# Patient Record
Sex: Male | Born: 1985
Health system: Southern US, Community
[De-identification: ages and names within clinical notes are randomized; demographics above are authoritative.]

## PROBLEM LIST (undated history)

## (undated) DIAGNOSIS — E119 Type 2 diabetes mellitus without complications: Secondary | ICD-10-CM

## (undated) HISTORY — DX: Type 2 diabetes mellitus without complications: E11.9

---

## 2004-07-10 ENCOUNTER — Ambulatory Visit: Payer: Self-pay | Admitting: Unknown Physician Specialty

## 2004-07-18 ENCOUNTER — Ambulatory Visit: Payer: Self-pay | Admitting: Unknown Physician Specialty

## 2007-06-26 ENCOUNTER — Ambulatory Visit: Payer: Self-pay | Admitting: Family Medicine

## 2008-08-13 ENCOUNTER — Inpatient Hospital Stay: Payer: Self-pay | Admitting: Internal Medicine

## 2010-12-05 ENCOUNTER — Emergency Department: Payer: Self-pay | Admitting: Emergency Medicine

## 2011-05-26 ENCOUNTER — Other Ambulatory Visit: Payer: Self-pay

## 2011-05-26 ENCOUNTER — Ambulatory Visit (HOSPITAL_COMMUNITY)
Admission: RE | Admit: 2011-05-26 | Discharge: 2011-05-26 | Disposition: A | Discharge status: Home / Self Care | Payer: BC Managed Care – PPO | Source: Ambulatory Visit | Attending: General Practice | Admitting: General Practice

## 2011-05-26 DIAGNOSIS — H538 Other visual disturbances: Secondary | ICD-10-CM | POA: Insufficient documentation

## 2011-05-26 DIAGNOSIS — M436 Torticollis: Secondary | ICD-10-CM | POA: Insufficient documentation

## 2011-05-26 DIAGNOSIS — E119 Type 2 diabetes mellitus without complications: Secondary | ICD-10-CM | POA: Insufficient documentation

## 2011-05-26 DIAGNOSIS — R51 Headache: Secondary | ICD-10-CM | POA: Insufficient documentation

## 2011-05-26 DIAGNOSIS — R519 Headache, unspecified: Secondary | ICD-10-CM

## 2011-05-26 MED ORDER — GADOBENATE DIMEGLUMINE 529 MG/ML IV SOLN
15.0000 mL | Freq: Once | INTRAVENOUS | Status: AC | PRN
  Administered 2011-05-26: 13 mL via INTRAVENOUS

## 2011-05-27 LAB — POCT I-STAT, CHEM 8
Chloride: 101 mEq/L (ref 96–112)
HCT: 47 % (ref 39.0–52.0)
Hemoglobin: 16 g/dL (ref 13.0–17.0)
Potassium: 3.8 mEq/L (ref 3.5–5.1)
Sodium: 140 mEq/L (ref 135–145)

## 2011-05-30 ENCOUNTER — Emergency Department: Payer: Self-pay | Admitting: Emergency Medicine

## 2011-05-30 LAB — CBC
MCH: 30.3 pg (ref 26.0–34.0)
MCHC: 34.2 g/dL (ref 32.0–36.0)
MCV: 88 fL (ref 80–100)
Platelet: 345 10*3/uL (ref 150–440)
RDW: 13.1 % (ref 11.5–14.5)
WBC: 8.1 10*3/uL (ref 3.8–10.6)

## 2011-05-30 LAB — COMPREHENSIVE METABOLIC PANEL
Albumin: 4.5 g/dL (ref 3.4–5.0)
Alkaline Phosphatase: 73 U/L (ref 50–136)
BUN: 10 mg/dL (ref 7–18)
Bilirubin,Total: 1.1 mg/dL — ABNORMAL HIGH (ref 0.2–1.0)
Chloride: 102 mmol/L (ref 98–107)
Creatinine: 1.32 mg/dL — ABNORMAL HIGH (ref 0.60–1.30)
EGFR (Non-African Amer.): >60
Glucose: 222 mg/dL — ABNORMAL HIGH (ref 65–99)
Potassium: 4.2 mmol/L (ref 3.5–5.1)
SGPT (ALT): 23 U/L
Sodium: 138 mmol/L (ref 136–145)
Total Protein: 7.9 g/dL (ref 6.4–8.2)

## 2011-07-12 ENCOUNTER — Ambulatory Visit: Payer: Self-pay | Admitting: General Practice

## 2011-07-15 ENCOUNTER — Ambulatory Visit: Payer: Self-pay | Admitting: Urology

## 2011-07-19 HISTORY — PX: NECK SURGERY: SHX720

## 2012-04-18 ENCOUNTER — Ambulatory Visit: Payer: Self-pay | Admitting: Family Medicine

## 2014-03-18 ENCOUNTER — Encounter: Payer: Self-pay | Admitting: Endocrinology

## 2014-04-02 ENCOUNTER — Encounter: Payer: Self-pay | Admitting: Internal Medicine

## 2014-04-02 ENCOUNTER — Ambulatory Visit (INDEPENDENT_AMBULATORY_CARE_PROVIDER_SITE_OTHER): Payer: BLUE CROSS/BLUE SHIELD | Admitting: Internal Medicine

## 2014-04-02 ENCOUNTER — Other Ambulatory Visit: Payer: Self-pay | Admitting: *Deleted

## 2014-04-02 VITALS — BP 112/76 | HR 93 | Temp 98.4°F | Resp 12 | Ht 66.0 in | Wt 149.8 lb

## 2014-04-02 DIAGNOSIS — E109 Type 1 diabetes mellitus without complications: Secondary | ICD-10-CM

## 2014-04-02 DIAGNOSIS — E1065 Type 1 diabetes mellitus with hyperglycemia: Principal | ICD-10-CM

## 2014-04-02 DIAGNOSIS — IMO0001 Reserved for inherently not codable concepts without codable children: Secondary | ICD-10-CM

## 2014-04-02 MED ORDER — GLUCAGON (RDNA) 1 MG IJ KIT: 1.0000 mg | PACK | Freq: Once | INTRAMUSCULAR | Status: DC | PRN

## 2014-04-02 MED ORDER — INSULIN GLARGINE 100 UNIT/ML SOLOSTAR PEN
32.0000 [IU] | PEN_INJECTOR | Freq: Every day | SUBCUTANEOUS | Status: DC
Start: 2014-04-02 — End: 2014-09-20

## 2014-04-02 MED ORDER — INSULIN ASPART 100 UNIT/ML FLEXPEN: 6.0000 [IU] | PEN_INJECTOR | Freq: Three times a day (TID) | SUBCUTANEOUS | Status: DC

## 2014-04-02 MED ORDER — GLUCOSE BLOOD VI STRP: ORAL_STRIP | Status: DC

## 2014-04-02 NOTE — Progress Notes (Signed)
Patient ID: Brian Braun, male   DOB: [birth year], 28 y.o.   MRN: [id number]   HPI: Brian Braun is a 28 y.o.-year-old male, self -referred , for management of DM1, uncontrolled, without complications. He saw Dr Gabriel Carina in the past. He previously saw Dr. Francoise Schaumann.  Patient has been diagnosed with diabetes in 1996; he started on insulin at dx.   Last hemoglobin A1c was: 10/11/2013: 7.6% 06/13/2013: 7.3%  Pt was on an insulin pump: Medtronic Minimed up to 2011 >> developed DKA >> did not restart after this, but he is not interested to restart.  He is now on: - Lantus 30 units at bedtime - NovoLog: ICR 1:15; target 130, ISF 30  He has an Therapist, occupational.   Pt checks his sugars 3-4x a day and they are: - am: 150-180 - 2h after b'fast: n/c - before lunch: 120-150 - 2h after lunch: n/c - before dinner: 150-200 (not as active in the wintertime) - 2h after dinner: n/c - bedtime: 140-170, 200 rarely - nighttime:  Can have lows. Lowest sugar was 40s >> last 2 mo ago: 70; he has hypoglycemia awareness at 34s. No previous hypoglycemia admission. Does have a glucagon kit at home. Highest sugar was 400s - seldom. + previous DKA admission - 1x: 2011.    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  Pt's meals are: - Breakfast: (Usually takes 11 units of NovoLog): oatmeal or bacon and eggs + cheese biscuit - Lunch: (12-13 units): sandwich or hamburger - Dinner: (11-12 units): grilled chicken and rice; spaghetti; sloppy joes - Snacks:PB crackers at 10 am; cheeze its - boluses 3-4 units   - no CKD, last BUN/creatinine:  Lab Results  Component Value Date   BUN 8 05/26/2011   CREATININE 1.00 05/26/2011   - No lipid levels available - last eye exam was in 2014. No DR.  - no numbness and tingling in his feet.  Pt has no FH of DM.  ROS: Constitutional: no weight gain/loss, no fatigue, no subjective  hyperthermia/hypothermia Eyes: no blurry vision, no xerophthalmia ENT: no sore throat, no nodules palpated in throat, no dysphagia/odynophagia, no hoarseness Cardiovascular: no CP/SOB/palpitations/leg swelling Respiratory: no cough/SOB Gastrointestinal: no N/V/D/C Musculoskeletal: no muscle/joint aches Skin: no rashes Neurological: no tremors/numbness/tingling/dizziness Psychiatric: no depression/anxiety  Past Medical History  Diagnosis Date  . Diabetes mellitus without complication     Type I    Past Surgical History  Procedure Laterality Date  . Neck surgery  07/2011   History   Social History  . Marital Status:  married     Spouse Name: N/A    Number of Children: 1   Occupational History  .  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 Smoking status: Never Smoker   . Smokeless tobacco: Current User    Types: Chew  . Alcohol Use: 0.0 oz/week    0 Not specified per week  . Drug Use: No   Social History Narrative   Married   1 stepson   No Known Allergies  Family history: None  PE: BP 112/76 mmHg  Pulse 93  Temp(Src) 98.4 F (36.9 C) (Oral)  Resp 12  Ht 5' 6" (1.676 m)  Wt 149 lb 12.8 oz (67.949 kg)  BMI  24.19 kg/m2  SpO2 98% Wt Readings from Last 3 Encounters:  04/02/14 149 lb 12.8 oz (67.949 kg)   Constitutional: Normal, in NAD Eyes: PERRLA, EOMI, no exophthalmos ENT: moist mucous membranes, no thyromegaly, no cervical lymphadenopathy Cardiovascular: RRR, No MRG Respiratory: CTA B Gastrointestinal: abdomen soft, NT, ND, BS+ Musculoskeletal: no deformities, strength intact in all 4 Skin: moist, warm, no rashes Neurological: no tremor with outstretched hands, DTR normal in all 4  ASSESSMENT: 1. DM1, uncontrolled, without complications  PLAN:  1. Patient with long-standing, fairly well controlled DM1, on basal-bolus  insulin therapy. 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 We will increase Lantus by 2-4 units, depending on morning sugars. I also advised him to change his insulin to carb ratio with lunch, as the sugars before dinner are the highest of the day. This will be fairly easy to do since he has an Accu-Chek expert. I advised him to let me know if he is not able to make the change in his meter. Linda Spagnnola, the diabetes educator can also help him to make the changes, If needed. - We discussed about changes to his insulin regimen, as follows:  Patient Instructions  Please increase Lantus 32 units at bedtime >> if sugars in am not <150 after this, in 1 week, please increase to 34 units. Please change NovoLog as follows: - Breakfast: ICR 1:15, target 130, ISF 30  - Lunch: ICR: 1:13, target 130, ISF 30 - Dinner: ICR 1:15; target 130, ISF 30   Please stop at the lab.  Please return in 1 month.  - Continue checking sugars at different times of the day - check 4 times a day, rotating checks - given sugar log and advised how to fill it and to bring it at next appt  - given foot care handout and explained the principles  - given instructions for hypoglycemia management "15-15 rule"  - advised for yearly eye exams - sent glucagon kit x2 Rx to pharmacy - advised to get ketone strips - advised to always have Glu tablets with him - advised for a Med-alert bracelet mentioning "type 1 diabetes mellitus". - given instruction Re: exercising and driving in DM1 (pt instructions) - no signs of other autoimmune disorders, but no previous TSH levels available for review - Today we will check: Orders Placed This Encounter  Procedures  . HgB A1c  . Microalbumin / creatinine urine ratio  . Comp Met (CMET)  . Lipid Profile  . TSH  - I  reviewed records from Dr. Solum, patient's diabetes was never very uncontrolled, his range is 7.2-7.6%. I suspect that he still has some insulin production from his pancreas. Since he is not interested in the insulin pump right now, I would not check a C-peptide. - I refilled his insulins and his glucose strips - Return to clinic in 1 mo with sugar log   - time spent with the patient: 1 hour, of which >50% was spent in obtaining information about hisdisease, reviewing together  previous labs, office visit notes, sugar values, previous DM treatments, and also counseling pt about hisdiabetes  (please see the discussed topics above), and developing a plan to prevent further hypoglycemia and hyperglycemia.   Component     Latest Ref Rng 04/09/2014  Sodium       135 - 145 mEq/L 138  Potassium     3.5 - 5.1 mEq/L 3.6  Chloride     96 - 112 mEq/L 103  CO2     19 - 32 mEq/L 29  Glucose     70 - 99 mg/dL 99  BUN     6 - 23 mg/dL 9  Creatinine     0.4 - 1.5 mg/dL 1.2  Total Bilirubin     0.2 - 1.2 mg/dL 1.1  Alkaline Phosphatase     39 - 117 U/L 60  AST     0 - 37 U/L 20  ALT     0 - 53 U/L 14  Total Protein     6.0 - 8.3 g/dL 7.2  Albumin     3.5 - 5.2 g/dL 4.4  Calcium     8.4 - 10.5 mg/dL 9.3  GFR     >60.00 mL/min 78.07  Cholesterol     0 - 200 mg/dL 125  Triglycerides     0.0 - 149.0 mg/dL 88.0  HDL     >39.00 mg/dL 36.70 (L)  VLDL     0.0 - 40.0 mg/dL 17.6  LDL (calc)     0 - 99 mg/dL 71  Total CHOL/HDL Ratio      3  NonHDL      88.30  Microalb, Ur     0.0 - 1.9 mg/dL 4.0 (H)  Creatinine,U      198.0  MICROALB/CREAT RATIO     0.0 - 30.0 mg/g 2.0  TSH     0.35 - 4.50 uIU/mL 0.83  Hgb A1c MFr Bld     4.6 - 6.5 % 7.6 (H)   HbA1c stable at 7.6%, no urinary proteins, TSH normal, cholesterol at goal except HDL which is slightly low, normal CMP. 

## 2014-04-02 NOTE — Patient Instructions (Signed)
Please increase Lantus 32 units at bedtime >> if sugars in am not <150 after this, in 1 week, please increase to 34 units. Please change NovoLog as follows: - Breakfast: ICR 1:15, target 130, ISF 30  - Lunch: ICR: 1:13, target 130, ISF 30 - Dinner: ICR 1:15; target 130, ISF 30   Please stop at the lab.  Please return in 1 month.  Basic Rules for Patients with Type I Diabetes Mellitus  1. The American Diabetes Association (ADA) recommended targets: - fasting sugar <130 - after meal sugar <180 - HbA1C <7%  2. Engage in ?150 min moderate exercise per week  3. Make sure you have ?8h of sleep every night as this helps both blood sugars and your weight.  4. Always keep a sugar log (not only record in your meter) and bring it to all appointments with us.  5. "15-15 rule" for hypoglycemia: if sugars are low, take 15 g of carbs** ("fast sugar" - e.g. 4 glucose tablets, 4 oz orange juice), wait 15 min, then check sugars again. If still <80, repeat. Continue  until your sugars >80, then eat a normal meal.   6. Teach family members and coworkers to inject glucagon. Have a glucagon set at home and one at work. They should call 911 after using the set.  7. Check sugar before driving. If <100, correct, and only start driving if sugars rise ?[id number]. Check sugar every hour when on a long drive.  8. Check sugar before exercising. If <100, correct, and only start exercising if sugars rise ?100. Check sugar every hour when on a long exercise routine and 1h after you finished exercising.   If >250, check urine for ketones. If you have moderate-large ketones in urine, do not start exercise. Hydrate yourself with clear liquids and correct the high sugar. Recheck sugars and ketones before attempting to exercise.  Be aware that you might need less insulin when exercising.  *intense, short, exercise bursts can increase your sugars, but  *less intense, longer (>1h), exercise routines can decrease your sugars.    9. Make sure you have a MedAlert bracelet or pendant mentioning "Type I Diabetes Mellitus". If you have a prior episode of severe hypoglycemia or hypoglycemia unawareness, it should also mention this.  10. Please do not walk barefoot. Inspect your feet for sores/cuts and let us know if you have them.  11. Please call [facility] with any questions and concerns [phone]).   **E.g. of "fast carbs": ? first choice (15 g):  1 tube glucose gel, GlucoPouch 15, 2 oz glucose liquid ? second choice (15-16 g):  3 or 4 glucose tablets (best taken  with water), 15 Dextrose Bits chewable ? third choice (15-20 g):   cup fruit juice,  cup regular soda, 1 cup skim milk,  1 cup sports drink ? fourth choice (15-20 g):  1 small tube [place] gel (not frosting), 2 tbsp raisins, 1 tbsp table sugar,  candy, jelly beans, gum drops - check package for carb amount   (adapted from: Juluis RainierMcCall A.L. "Insulin therapy and hypoglycemia" Endocrinol Metab Clin N Am 2012, 41: 57-87)

## 2014-04-09 ENCOUNTER — Other Ambulatory Visit (INDEPENDENT_AMBULATORY_CARE_PROVIDER_SITE_OTHER): Payer: BC Managed Care – PPO

## 2014-04-09 DIAGNOSIS — E109 Type 1 diabetes mellitus without complications: Secondary | ICD-10-CM

## 2014-04-09 DIAGNOSIS — E1065 Type 1 diabetes mellitus with hyperglycemia: Principal | ICD-10-CM

## 2014-04-09 DIAGNOSIS — IMO0001 Reserved for inherently not codable concepts without codable children: Secondary | ICD-10-CM

## 2014-04-09 LAB — COMPREHENSIVE METABOLIC PANEL
ALBUMIN: 4.4 g/dL (ref 3.5–5.2)
ALT: 14 U/L (ref 0–53)
AST: 20 U/L (ref 0–37)
Alkaline Phosphatase: 60 U/L (ref 39–117)
BILIRUBIN TOTAL: 1.1 mg/dL (ref 0.2–1.2)
BUN: 9 mg/dL (ref 6–23)
CO2: 29 mEq/L (ref 19–32)
Calcium: 9.3 mg/dL (ref 8.4–10.5)
Chloride: 103 mEq/L (ref 96–112)
Creatinine, Ser: 1.2 mg/dL (ref 0.4–1.5)
GFR: 78.07 mL/min (ref >60.00)
GLUCOSE: 99 mg/dL (ref 70–99)
POTASSIUM: 3.6 meq/L (ref 3.5–5.1)
SODIUM: 138 meq/L (ref 135–145)
Total Protein: 7.2 g/dL (ref 6.0–8.3)

## 2014-04-09 LAB — HEMOGLOBIN A1C: HEMOGLOBIN A1C: 7.6 % — AB (ref 4.6–6.5)

## 2014-04-09 LAB — MICROALBUMIN / CREATININE URINE RATIO
CREATININE, U: 198 mg/dL
MICROALB UR: 4 mg/dL — AB (ref 0.0–1.9)
MICROALB/CREAT RATIO: 2 mg/g (ref 0.0–30.0)

## 2014-04-09 LAB — LIPID PANEL
CHOL/HDL RATIO: 3
Cholesterol: 125 mg/dL (ref 0–200)
HDL: 36.7 mg/dL — ABNORMAL LOW (ref >39.00)
LDL Cholesterol: 71 mg/dL (ref 0–99)
NONHDL: 88.3
Triglycerides: 88 mg/dL (ref 0.0–149.0)
VLDL: 17.6 mg/dL (ref 0.0–40.0)

## 2014-04-09 LAB — TSH: TSH: 0.83 u[IU]/mL (ref 0.35–4.50)

## 2014-04-10 ENCOUNTER — Encounter: Payer: Self-pay | Admitting: *Deleted

## 2014-05-09 ENCOUNTER — Ambulatory Visit: Payer: BC Managed Care – PPO | Admitting: Internal Medicine

## 2014-05-09 ENCOUNTER — Ambulatory Visit (INDEPENDENT_AMBULATORY_CARE_PROVIDER_SITE_OTHER): Payer: Self-pay | Admitting: Internal Medicine

## 2014-05-09 ENCOUNTER — Encounter: Payer: Self-pay | Admitting: Internal Medicine

## 2014-05-09 VITALS — BP 118/76 | HR 86 | Temp 98.1°F | Resp 12 | Wt 151.0 lb

## 2014-05-09 DIAGNOSIS — E119 Type 2 diabetes mellitus without complications: Secondary | ICD-10-CM

## 2014-05-09 NOTE — Patient Instructions (Signed)
Please continue Lantus 32 units and move this to am. Please change NovoLog as follows: - Breakfast: ICR 1:15, target 120, ISF 30  - Lunch: ICR: 1:13, target 120, ISF 30 - Dinner: ICR 1:15; target 120, ISF 30   Please return in 1 month.

## 2014-05-09 NOTE — Progress Notes (Signed)
Patient ID: Brian Braun, male   DOB: [birth year], 29 y.o.   MRN: [id number]   HPI: Brian Braun is a 29 y.o.-year-old male, returning for f/u for DM1, dx 1996, uncontrolled, without complications. Last visit 1 mo ago. He is here with his wife who offers part of the history.  Last hemoglobin A1c was: Lab Results  Component Value Date   HGBA1C 7.6* 04/09/2014   10/11/2013: 7.6% 06/13/2013: 7.3%  Pt was on an insulin pump: Medtronic Minimed up to 2011 >> developed DKA >> did not restart after this, but he is not interested to restart.  He is now on: - Lantus 30 >> 32 units at bedtime - NovoLog as follows: - Breakfast: ICR 1:15, target 130, ISF 30  - Lunch: ICR: 1:13, target 130, ISF 30 - Dinner: ICR 1:15; target 130, ISF 30  Ends up 36 units (10-12 per meal) He has an Therapist, occupational.   Pt checks his sugars 4x a day and they are higher over the holidays. He brings a very detailed log, with 4 checks a day, without low CBGs. However, patient's wife is telling me that he is hypoglycemic in the early morning and more difficult to arise. They only check his sugars once and he was in the 40, otherwise they are not checking when they suspect a low blood sugar. - am: 150-180 >> 98-208 - 2h after b'fast: n/c - before lunch: 120-150 >> 90, 118-208, 221 - 2h after lunch: n/c - before dinner: 150-200 (not as active in the wintertime) >> 95-235 - 2h after dinner: n/c - bedtime: 140-170, 200 rarely >>141-241 - nighttime:  Can have lows. Lowest sugar was 40 - early am; he has hypoglycemia awareness at 11s. No previous hypoglycemia admission. Does have a glucagon kit at home. Highest sugar was 400s - seldom. + previous DKA admission - 1x: 2011.    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  Pt's meals are: - Breakfast: (Usually takes 11 units of NovoLog): oatmeal or bacon and eggs + cheese biscuit - Lunch: (12-13  units): sandwich or hamburger - Dinner: (11-12 units): grilled chicken and rice; spaghetti; sloppy joes - Snacks:PB crackers at 10 am; cheeze its - boluses 3-4 units   - no CKD, last BUN/creatinine:  Lab Results  Component Value Date   BUN 9 04/09/2014   CREATININE 1.2 04/09/2014   No urinary protein at last check: Component     Latest Ref Rng 04/09/2014  Microalb, Ur     0.0 - 1.9 mg/dL 4.0 (H)  Creatinine,U      198.0  MICROALB/CREAT RATIO     0.0 - 30.0 mg/g 2.0   - Latest lipid panel: Lab Results  Component Value Date   CHOL 125 04/09/2014   HDL 36.70* 04/09/2014   LDLCALC 71 04/09/2014   TRIG 88.0 04/09/2014   CHOLHDL 3 04/09/2014   - Last TSH level: Lab Results  Component Value Date   TSH 0.83 04/09/2014   - last eye exam was in 2014. No DR.  - no numbness and tingling in his feet.  ROS: Constitutional: no weight gain/loss, no fatigue, no subjective hyperthermia/hypothermia Eyes: no blurry vision, no xerophthalmia ENT: no sore throat, no nodules palpated in throat, no dysphagia/odynophagia, no hoarseness Cardiovascular: no CP/SOB/palpitations/leg swelling Respiratory: no cough/SOB Gastrointestinal: no N/V/D/C Musculoskeletal: no muscle/joint aches Skin: no rashes Neurological: no tremors/numbness/tingling/dizziness  I reviewed  pt's medications, allergies, PMH, social hx, family hx, and changes were documented in the history of present illness. Otherwise, unchanged from my initial visit note:  Past Medical History  Diagnosis Date  . Diabetes mellitus without complication     Type I    Past Surgical History  Procedure Laterality Date  . Neck surgery  07/2011   History   Social History  . Marital Status:  married     Spouse Name: N/A    Number of Children: 1   Occupational History  .  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 Smoking status: Never Smoker   . Smokeless tobacco: Current User    Types: Chew  . Alcohol Use: 0.0 oz/week    0 Not specified per week  . Drug Use: No   Social History Narrative   Married   1 stepson   No Known Allergies  Family history: None  PE: BP 118/76 mmHg  Pulse 86  Temp(Src) 98.1 F (36.7 C) (Oral)  Resp 12  Wt 151 lb (68.493 kg)  SpO2 96% Wt Readings from Last 3 Encounters:  05/09/14 151 lb (68.493 kg)  04/02/14 149 lb 12.8 oz (67.949 kg)   Constitutional: Normal, in NAD Eyes: PERRLA, EOMI, no exophthalmos ENT: moist mucous membranes, no thyromegaly, no cervical lymphadenopathy Cardiovascular: RRR, No MRG Respiratory: CTA B Gastrointestinal: abdomen soft, NT, ND, BS+ Musculoskeletal: no deformities, strength intact in all 4 Skin: moist, warm, no rashes Neurological: no tremor with outstretched hands, DTR normal in all 4  ASSESSMENT: 1. DM1, uncontrolled, without complications - I reviewed records from Dr. Gabriel Carina, patient's diabetes was never very uncontrolled, his range is 7.2-7.6%. I suspect that he still has some insulin production from his pancreas. Since he is not interested in the insulin pump right now, I did not check a C-peptide.  PLAN:  1. Patient with long-standing, fairly well controlled DM1, on basal-bolus insulin therapy. His sugars are high in the morning, but per his wife's report, he is more groggy in the morning and more difficult to arise. She suspects early morning hypoglycemia episodes. They did not check the sugars when this happened, but they did check once and he was in the 40s. There are no low blood sugars he his log and I strongly advised him to write them down whenever they happen. - I advised them to change Lantus from evening to a.m. to avoid precipitous drops in blood sugars overnight. If this does not work, we'll need to split Lantus in 2 doses. - I also advised him to change his target sugar from 130 to 120. This will be  fairly easy to do since he has an Accu-Chek expert. - We discussed about changes to his insulin regimen, as follows:  Patient Instructions  Please continue Lantus 32 units and move this to am. Please change NovoLog as follows: - Breakfast: ICR 1:15, target 120, ISF 30  - Lunch: ICR: 1:13, target 120, ISF 30 - Dinner: ICR 1:15; target 120, ISF 30   Please return in 1 month.  - Continue checking sugars at different times of the day - check 4 times a day, rotating checks - advised for yearly eye exams >> he is due - no signs of other autoimmune disorders - Return to clinic in 1 mo with sugar log

## 2014-06-20 ENCOUNTER — Ambulatory Visit (INDEPENDENT_AMBULATORY_CARE_PROVIDER_SITE_OTHER): Payer: BC Managed Care – PPO | Admitting: Internal Medicine

## 2014-06-20 ENCOUNTER — Encounter: Payer: Self-pay | Admitting: Internal Medicine

## 2014-06-20 VITALS — BP 120/68 | HR 90 | Temp 98.2°F | Resp 12 | Wt 153.0 lb

## 2014-06-20 DIAGNOSIS — E119 Type 2 diabetes mellitus without complications: Secondary | ICD-10-CM

## 2014-06-20 NOTE — Patient Instructions (Signed)
Please continue Lantus 32 units in am.  Please change NovoLog as follows: - Breakfast: ICR 1:15, target 120, ISF 30  - Lunch: ICR: 1:13, target 120, ISF 30 - Dinner: ICR 1:15; target 120, ISF 30   When you start being more active in the afternoon, please increase the ICR with lunch to 1:15.   Please return in 3 months.

## 2014-06-20 NOTE — Progress Notes (Signed)
Patient ID: Brian Braun, male   DOB: April 18, 1986, 28 y.o.   MRN: [id number]   HPI: Brian Braun is a 29 y.o.-year-old male, returning for f/u for DM1, dx 1996, uncontrolled, without complications. Last visit 1.5 mo ago.  Last hemoglobin A1c was: Lab Results  Component Value Date   HGBA1C 7.6* 04/09/2014   10/11/2013: 7.6% 06/13/2013: 7.3%  Pt was on an insulin pump: Medtronic Minimed up to 2011 >> developed DKA >> did not restart after this, but he is not interested to restart.  He is now on: - Lantus 30 >> 32 units at bedtime >> in am - NovoLog as follows: - Breakfast: ICR 1:15, target 120, ISF 30  - Lunch: ICR: 1:13, target 120, ISF 30 - Dinner: ICR 1:15; target 120, ISF 30  Ends up 36 units (10-12 per meal) He has an Therapist, occupational.   Pt checks his sugars 4x a day and they are much better, with no more lows after moving Lantus in am: - am: 150-180 >> 98-208 >> 82-140, some 200s - 2h after b'fast: n/c - before lunch: 120-150 >> 90, 118-208, 221 >> 87-175, 221 - 2h after lunch: n/c - before dinner: 150-200 (not as active in the wintertime) >> 95-235 >> 95-182, 210 - 2h after dinner: n/c - bedtime: 140-170, 200 rarely >>141-241 >> 82-163, 181 - nighttime:  No more lows. Lowest sugar was 70s; he has hypoglycemia awareness at 50s. No previous hypoglycemia admission. Does have a glucagon kit at home. Highest sugar was 400s - seldom. + previous DKA admission - 1x: 2011.    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  Pt's meals are: - Breakfast: (Usually takes 11 units of NovoLog): oatmeal or bacon and eggs + cheese biscuit - Lunch: (12-13 units): sandwich or hamburger - Dinner: (11-12 units): grilled chicken and rice; spaghetti; sloppy joes - Snacks:PB crackers at 10 am; cheeze its - boluses 3-4 units   - no CKD, last BUN/creatinine:  Lab Results  Component Value Date   BUN 9 04/09/2014    CREATININE 1.2 04/09/2014   No urinary protein at last check: Component     Latest Ref Rng 04/09/2014  Microalb, Ur     0.0 - 1.9 mg/dL 4.0 (H)  Creatinine,U      198.0  MICROALB/CREAT RATIO     0.0 - 30.0 mg/g 2.0   - Latest lipid panel: Lab Results  Component Value Date   CHOL 125 04/09/2014   HDL 36.70* 04/09/2014   LDLCALC 71 04/09/2014   TRIG 88.0 04/09/2014   CHOLHDL 3 04/09/2014   - Last TSH level: Lab Results  Component Value Date   TSH 0.83 04/09/2014   - last eye exam was in 2014. No DR.  - no numbness and tingling in his feet.  ROS: Constitutional: no weight gain/loss, no fatigue, no subjective hyperthermia/hypothermia Eyes: no blurry vision, no xerophthalmia ENT: no sore throat, no nodules palpated in throat, no dysphagia/odynophagia, no hoarseness Cardiovascular: no CP/SOB/palpitations/leg swelling Respiratory: no cough/SOB Gastrointestinal: no N/V/D/C Musculoskeletal: no muscle/joint aches Skin: no rashes Neurological: no tremors/numbness/tingling/dizziness  I reviewed pt's medications, allergies, PMH, social hx, family hx, and changes were documented in the history of present illness. Otherwise, unchanged from my initial visit note:  Past Medical History  Diagnosis Date  . Diabetes mellitus without complication     Type I    Past Surgical  History  Procedure Laterality Date  . Neck surgery  07/2011   History   Social History  . Marital Status:  married     Spouse Name: N/A    Number of Children: 1   Occupational History  .  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 Smoking status: Never Smoker   . Smokeless tobacco: Current User    Types: Chew  . Alcohol Use: 0.0 oz/week    0 Not specified per week  . Drug Use: No   Social History Narrative   Married   1 stepson   No Known Allergies  Family history:  None  PE: BP 120/68 mmHg  Pulse 90  Temp(Src) 98.2 F (36.8 C) (Oral)  Resp 12  Wt 153 lb (69.4 kg)  SpO2 97% Wt Readings from Last 3 Encounters:  06/20/14 153 lb (69.4 kg)  05/09/14 151 lb (68.493 kg)  04/02/14 149 lb 12.8 oz (67.949 kg)   Constitutional: Normal, in NAD Eyes: PERRLA, EOMI, no exophthalmos ENT: moist mucous membranes, no thyromegaly, no cervical lymphadenopathy Cardiovascular: RRR, No MRG Respiratory: CTA B Gastrointestinal: abdomen soft, NT, ND, BS+ Musculoskeletal: no deformities, strength intact in all 4 Skin: moist, warm, no rashes Neurological: no tremor with outstretched hands, DTR normal in all 4  ASSESSMENT: 1. DM1, uncontrolled, without complications - I reviewed records from Dr. Gabriel Carina, patient's diabetes was never very uncontrolled, his range is 7.2-7.6%. I suspect that he still has some insulin production from his pancreas. Since he is not interested in the insulin pump right now, I did not check a C-peptide.  PLAN:  1. Patient with long-standing, fairly well controlled DM1, on basal-bolus insulin therapy. His sugars are better and he has no more lows after we changed the target CBG and moved Lantus in am at last visit. He plans to start his mowing activity at the end of this month >> he will be active in pm >> will advise to increase his ICR with lunch then. - I advised him to:  Patient Instructions  Please continue Lantus 32 units in am.  Please change NovoLog as follows: - Breakfast: ICR 1:15, target 120, ISF 30  - Lunch: ICR: 1:13, target 120, ISF 30 - Dinner: ICR 1:15; target 120, ISF 30   When you start being more active in the afternoon, please increase the ICR with lunch to 1:15.   Please return in 3 months.  - Continue checking sugars at different times of the day - check 4 times a day, rotating checks - advised for yearly eye exams >> he is due! - no signs of other autoimmune disorders - he has no PCP >> advised him to start seeing  one - no HbA1c yet - Return to clinic in 3 mo with sugar log

## 2014-09-20 ENCOUNTER — Encounter: Payer: Self-pay | Admitting: Internal Medicine

## 2014-09-20 ENCOUNTER — Ambulatory Visit (INDEPENDENT_AMBULATORY_CARE_PROVIDER_SITE_OTHER): Payer: BLUE CROSS/BLUE SHIELD | Admitting: Internal Medicine

## 2014-09-20 VITALS — BP 116/62 | HR 77 | Temp 98.3°F | Resp 12 | Wt 143.0 lb

## 2014-09-20 DIAGNOSIS — E119 Type 2 diabetes mellitus without complications: Secondary | ICD-10-CM

## 2014-09-20 LAB — HEMOGLOBIN A1C: Hgb A1c MFr Bld: 6.4 % (ref 4.6–6.5)

## 2014-09-20 MED ORDER — INSULIN GLARGINE 100 UNIT/ML SOLOSTAR PEN: 32.0000 [IU] | PEN_INJECTOR | Freq: Every day | SUBCUTANEOUS | Status: DC

## 2014-09-20 MED ORDER — GLUCOSE BLOOD VI STRP: ORAL_STRIP | Status: DC

## 2014-09-20 MED ORDER — INSULIN ASPART 100 UNIT/ML FLEXPEN: 6.0000 [IU] | PEN_INJECTOR | Freq: Three times a day (TID) | SUBCUTANEOUS | Status: DC

## 2014-09-20 NOTE — Patient Instructions (Signed)
Please continue Lantus 32 units in am.  Please continue NovoLog as follows: - Breakfast: ICR 1:15, target 120, ISF 30  - Lunch: ICR: 1:13, target 120, ISF 30 - Dinner: ICR 1:15; target 120, ISF 30   When you are more active in the afternoon, you can increase the ICR with lunch to 1:15.   Please stop at the lab.  Please return in 3 months.

## 2014-09-20 NOTE — Progress Notes (Signed)
Patient ID: Brian Braun, male   DOB: [birth year], 29 y.o.   MRN: [id number]   HPI: Brian Braun is a 29 y.o.-year-old male, returning for f/u for DM1, dx 1996, uncontrolled, without complications. Last visit 3 mo ago.  Last hemoglobin A1c was: Lab Results  Component Value Date   HGBA1C 7.6* 04/09/2014   10/11/2013: 7.6% 06/13/2013: 7.3%  Pt was on an insulin pump: Medtronic Minimed up to 2011 >> developed DKA >> did not restart after this, but he is not interested to restart.  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   Pt checks his sugars 4x a day and they are stable, with no more lows after moving Lantus in am: - am: 150-180 >> 98-208 >> 82-140, some 200s >> 87-145, 165, 207 - 2h after b'fast: n/c - before lunch: 120-150 >> 90, 118-208, 221 >> 87-175, 221 >> 88-155, 180, 201 - 2h after lunch: n/c - before dinner: 150-200 (not as active in the wintertime) >> 95-235 >> 95-182, 210 >> 82, 107-147, 195 - 2h after dinner: n/c - bedtime: 140-170, 200 rarely >>141-241 >> 82-163, 181 >> 95-163, 200 - nighttime:  No more lows. Lowest sugar was 70s; he has hypoglycemia awareness at 50s. No previous hypoglycemia admission. Does have a glucagon kit at home. Highest sugar was 400s - seldom. + previous DKA admission - 1x: 2011.    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  Pt's meals are: - Breakfast: (Usually takes 11 units of NovoLog): oatmeal or bacon and eggs + cheese biscuit - Lunch: (12-13 units): sandwich or hamburger - Dinner: (11-12 units): grilled chicken and rice; spaghetti; sloppy joes - Snacks:PB crackers at 10 am; cheeze its -  boluses 3-4 units   - no CKD, last BUN/creatinine:  Lab Results  Component Value Date   BUN 9 04/09/2014   CREATININE 1.2 04/09/2014   No urinary protein at last check: Component     Latest Ref Rng 04/09/2014  Microalb, Ur     0.0 - 1.9 mg/dL 4.0 (H)  Creatinine,U      198.0  MICROALB/CREAT RATIO     0.0 - 30.0 mg/g 2.0   - Latest lipid panel: Lab Results  Component Value Date   CHOL 125 04/09/2014   HDL 36.70* 04/09/2014   LDLCALC 71 04/09/2014   TRIG 88.0 04/09/2014   CHOLHDL 3 04/09/2014   - Last TSH level: Lab Results  Component Value Date   TSH 0.83 04/09/2014   - last eye exam was in 2014. No DR.  - no numbness and tingling in his feet.  ROS: Constitutional: no weight gain/loss, no fatigue, no subjective hyperthermia/hypothermia Eyes: no blurry vision, no xerophthalmia ENT: no sore throat, no nodules palpated in throat, no dysphagia/odynophagia, no hoarseness Cardiovascular: no CP/SOB/palpitations/leg swelling Respiratory: no cough/SOB Gastrointestinal: no N/V/D/C Musculoskeletal: no muscle/joint aches Skin: no rashes Neurological: no tremors/numbness/tingling/dizziness  I reviewed pt's medications, allergies, PMH, social hx, family hx, and changes were documented in the history of present illness. Otherwise, unchanged from my initial  visit note:  Past Medical History  Diagnosis Date  . Diabetes mellitus without complication     Type I    Past Surgical History  Procedure Laterality Date  . Neck surgery  07/2011   History   Social History  . Marital Status:  married     Spouse Name: N/A    Number of Children: 1   Occupational History  .  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 Smoking status: Never Smoker   . Smokeless tobacco: Current User    Types: Chew  . Alcohol Use: 0.0 oz/week    0 Not specified per week  .  Drug Use: No   Social History Narrative   Married   1 stepson   No Known Allergies  Family history: None  PE: BP 116/62 mmHg  Pulse 77  Temp(Src) 98.3 F (36.8 C) (Oral)  Resp 12  Wt 143 lb (64.864 kg)  SpO2 96% Wt Readings from Last 3 Encounters:  09/20/14 143 lb (64.864 kg)  06/20/14 153 lb (69.4 kg)  05/09/14 151 lb (68.493 kg)   Constitutional: Normal, in NAD Eyes: PERRLA, EOMI, no exophthalmos ENT: moist mucous membranes, no thyromegaly, no cervical lymphadenopathy Cardiovascular: RRR, No MRG Respiratory: CTA B Gastrointestinal: abdomen soft, NT, ND, BS+ Musculoskeletal: no deformities, strength intact in all 4 Skin: moist, warm, no rashes Neurological: no tremor with outstretched hands, DTR normal in all 4  ASSESSMENT: 1. DM1, uncontrolled, without complications - I reviewed records from Dr. Gabriel Carina, patient's diabetes was never very uncontrolled, his range is 7.2-7.6%. I suspect that he still has some insulin production from his pancreas. Since he is not interested in the insulin pump right now, I did not check a C-peptide.  PLAN:  1. Patient with long-standing, fairly well controlled DM1, on basal-bolus insulin therapy. His sugars are stable and he has no more lows after we changed the target CBG and moved Lantus in am at last visit. 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 - I advised him to:  Patient Instructions  Please continue Lantus 32 units in am.  Please continue NovoLog as follows: - Breakfast: ICR 1:15, target 120, ISF 30  - Lunch: ICR: 1:13, target 120, ISF 30 - Dinner: ICR 1:15; target 120, ISF 30   When you are more active in the afternoon, you can increase the ICR with lunch to 1:15.   Please stop at the lab.  Please return in 3 months.  - Continue checking sugars at different times of the day - check 4 times a day,  rotating checks - advised for yearly eye exams >> he is due! - no signs of other autoimmune disorders - he has no PCP >> had advised him to start seeing one - check HbA1c today - Return to clinic in 3 mo with sugar log   Office Visit on 09/20/2014  Component Date Value Ref Range Status  . Hgb A1c MFr Bld 09/20/2014 6.4  4.6 - 6.5 % Final   Glycemic Control Guidelines for People with Diabetes:Non Diabetic:  <6%Goal of Therapy: <7%Additional Action Suggested:  >8%    HbA1c improved further.

## 2014-10-12 ENCOUNTER — Other Ambulatory Visit: Payer: Self-pay | Admitting: Internal Medicine

## 2014-12-24 ENCOUNTER — Ambulatory Visit (INDEPENDENT_AMBULATORY_CARE_PROVIDER_SITE_OTHER): Payer: Managed Care, Other (non HMO) | Admitting: Internal Medicine

## 2014-12-24 ENCOUNTER — Other Ambulatory Visit (INDEPENDENT_AMBULATORY_CARE_PROVIDER_SITE_OTHER): Payer: Managed Care, Other (non HMO) | Admitting: *Deleted

## 2014-12-24 ENCOUNTER — Encounter: Payer: Self-pay | Admitting: Internal Medicine

## 2014-12-24 VITALS — BP 108/62 | HR 93 | Temp 98.1°F | Resp 12 | Wt 146.8 lb

## 2014-12-24 DIAGNOSIS — E119 Type 2 diabetes mellitus without complications: Secondary | ICD-10-CM

## 2014-12-24 LAB — POCT GLYCOSYLATED HEMOGLOBIN (HGB A1C): HEMOGLOBIN A1C: 7.3

## 2014-12-24 MED ORDER — GLUCOSE BLOOD VI STRP: ORAL_STRIP | Status: DC

## 2014-12-24 MED ORDER — INSULIN PEN NEEDLE 32G X 4 MM MISC: Status: DC

## 2014-12-24 MED ORDER — INSULIN ASPART 100 UNIT/ML FLEXPEN: PEN_INJECTOR | SUBCUTANEOUS | Status: DC

## 2014-12-24 MED ORDER — INSULIN GLARGINE 100 UNIT/ML SOLOSTAR PEN: 32.0000 [IU] | PEN_INJECTOR | Freq: Every day | SUBCUTANEOUS | Status: DC

## 2014-12-24 NOTE — Patient Instructions (Signed)
Please continue Lantus 32 units in am.  Please continue NovoLog as follows: - Breakfast: ICR 1:15 >> 1:13, target 120, ISF 30  - Lunch: ICR: 1:13, target 120, ISF 30 - Dinner: ICR 1:15; target 120, ISF 30   Please return in 3 months.

## 2014-12-24 NOTE — Progress Notes (Signed)
Patient ID: Brian Braun, male   DOB: 02/21/1986, 29 y.o.   MRN: [id number]   HPI: Brian Braun is a 29 y.o.-year-old male, returning for f/u for DM1, dx 1996, uncontrolled, without complications. Last visit 3 mo ago.  He switched jobs >> less active job. Starting next spring >> goes to school and starts working nights. He still mows in the pm now.   Last hemoglobin A1c was: Lab Results  Component Value Date   HGBA1C 6.4 09/20/2014   HGBA1C 7.6* 04/09/2014   10/11/2013: 7.6% 06/13/2013: 7.3%  Pt was on an insulin pump: Medtronic Minimed up to 2011 >> developed DKA >> did not restart after this, but he is not interested to restart.  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   Pt checks his sugars 4x a day and they are: - am: 150-180 >> 98-208 >> 82-140, some 200s >> 87-145, 165, 207 >> 120-150, 180, 260 - 2h after b'fast: n/c - before lunch: 120-150 >> 90, 118-208, 221 >> 87-175, 221 >> 88-155, 180, 201 >> 150-220 - 2h after lunch: n/c - before dinner: 150-200 (not as active in the wintertime) >> 95-235 >> 95-182, 210 >> 82, 107-147, 195 >> 80-150 - 2h after dinner: n/c - bedtime: 140-170, 200 rarely >>141-241 >> 82-163, 181 >> 95-163, 200 >> 80-160 - nighttime:  No more lows. Lowest sugar was 75; he has hypoglycemia awareness at 50s. No previous hypoglycemia admission. Does have a glucagon kit at home. Highest sugar was 400s >> 260 + previous DKA admission - 1x: 2011.    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  Pt's meals are: - Breakfast: (Usually takes 11 units of NovoLog): oatmeal or bacon and eggs + cheese biscuit - Lunch:  (12-13 units): sandwich or hamburger - Dinner: (11-12 units): grilled chicken and rice; spaghetti; sloppy joes - Snacks:PB crackers at 10 am; cheeze its - boluses 3-4 units   - no CKD, last BUN/creatinine:  Lab Results  Component Value Date   BUN 9 04/09/2014   CREATININE 1.2 04/09/2014   No urinary protein at last check: Component     Latest Ref Rng 04/09/2014  Microalb, Ur     0.0 - 1.9 mg/dL 4.0 (H)  Creatinine,U      198.0  MICROALB/CREAT RATIO     0.0 - 30.0 mg/g 2.0   - Latest lipid panel: Lab Results  Component Value Date   CHOL 125 04/09/2014   HDL 36.70* 04/09/2014   LDLCALC 71 04/09/2014   TRIG 88.0 04/09/2014   CHOLHDL 3 04/09/2014   - Last TSH level: Lab Results  Component Value Date   TSH 0.83 04/09/2014   - last eye exam was in 2014. No DR.  - no numbness and tingling in his feet.  ROS: Constitutional: no weight gain/loss, no fatigue, no subjective hyperthermia/hypothermia Eyes: no blurry vision, no xerophthalmia ENT: no sore throat, no nodules palpated in throat, no dysphagia/odynophagia, no hoarseness Cardiovascular: no CP/SOB/palpitations/leg swelling Respiratory: no cough/SOB Gastrointestinal: no N/V/D/C Musculoskeletal: no muscle/joint aches   Skin: no rashes Neurological: no tremors/numbness/tingling/dizziness  I reviewed pt's medications, allergies, PMH, social hx, family hx, and changes were documented in the history of present illness. Otherwise, unchanged from my initial visit note:  Past Medical History  Diagnosis Date  . Diabetes mellitus without complication     Type I    Past Surgical History  Procedure Laterality Date  . Neck surgery  07/2011   History   Social History  . Marital Status:  married     Spouse Name: N/A    Number of Children: 1   Occupational History  .  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 Smoking status: Never Smoker   . Smokeless tobacco: Current User    Types: Chew  . Alcohol Use: 0.0 oz/week    0 Not specified per week  . Drug Use: No   Social History Narrative   Married   1 stepson   No Known Allergies  Family history: None  PE: BP 108/62 mmHg  Pulse 93  Temp(Src) 98.1 F (36.7 C) (Oral)  Resp 12  Wt 146 lb 12.8 oz (66.588 kg)  SpO2 97% Wt Readings from Last 3 Encounters:  12/24/14 146 lb 12.8 oz (66.588 kg)  09/20/14 143 lb (64.864 kg)  06/20/14 153 lb (69.4 kg)   Constitutional: Normal, in NAD Eyes: PERRLA, EOMI, no exophthalmos ENT: moist mucous membranes, no thyromegaly, no cervical lymphadenopathy Cardiovascular: RRR, No MRG Respiratory: CTA B Gastrointestinal: abdomen soft, NT, ND, BS+ Musculoskeletal: no deformities, strength intact in all 4 Skin: moist, warm, no rashes Neurological: no tremor with outstretched hands, DTR normal in all 4  ASSESSMENT: 1. DM1, uncontrolled, without complications - I reviewed records from Dr. Solum, patient's diabetes was never very uncontrolled, his range is 7.2-7.6%. I suspect that he still has some insulin production from his pancreas. Since he is not interested in the insulin pump right now, I did not check a C-peptide.  PLAN:  1. Patient with long-standing, fairly well controlled DM1, on basal-bolus insulin therapy. His sugars are worse especially before lunch as he is less active at work >> will increase Novolog with this meal. 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 Sugars at bedtime are good. - I advised him to:  Patient Instructions  Please continue Lantus 32 units in am.  Please continue NovoLog as follows: - Breakfast: ICR 1:15 >> 1:13, target 120, ISF 30  - Lunch: ICR: 1:13, target 120, ISF 30 - Dinner: ICR 1:15; target 120, ISF  30   Please return in 3 months.  - Continue checking sugars at different times of the day - check 4 times a day, rotating checks - advised for yearly eye exams >> he is due! - no signs of other autoimmune disorders - refilled all his Rx'es - check HbA1c today >> 7.3% (higher) - Return to clinic in 3 mo with sugar log   

## 2015-03-25 ENCOUNTER — Other Ambulatory Visit: Payer: Self-pay | Admitting: *Deleted

## 2015-03-25 ENCOUNTER — Ambulatory Visit (INDEPENDENT_AMBULATORY_CARE_PROVIDER_SITE_OTHER): Payer: Managed Care, Other (non HMO) | Admitting: Internal Medicine

## 2015-03-25 ENCOUNTER — Other Ambulatory Visit (INDEPENDENT_AMBULATORY_CARE_PROVIDER_SITE_OTHER): Payer: Managed Care, Other (non HMO) | Admitting: *Deleted

## 2015-03-25 ENCOUNTER — Encounter: Payer: Self-pay | Admitting: Internal Medicine

## 2015-03-25 VITALS — BP 118/60 | HR 83 | Temp 98.2°F | Resp 12 | Wt 156.6 lb

## 2015-03-25 DIAGNOSIS — E119 Type 2 diabetes mellitus without complications: Secondary | ICD-10-CM

## 2015-03-25 LAB — POCT GLYCOSYLATED HEMOGLOBIN (HGB A1C): Hemoglobin A1C: 6.2

## 2015-03-25 MED ORDER — INSULIN PEN NEEDLE 32G X 4 MM MISC: Status: DC

## 2015-03-25 NOTE — Progress Notes (Signed)
Patient ID: Brian Braun, male   DOB: Mar 06, 1986, 29 y.o.   MRN: [id number]   HPI: Brian Braun is a 29 y.o.-year-old male, returning for f/u for DM1, dx 1996, uncontrolled, without complications. Last visit 3 mo ago.  Starting next spring >> goes to school and starts working nights.   Last hemoglobin A1c was: Lab Results  Component Value Date   HGBA1C 6.2 03/25/2015   HGBA1C 7.3 12/24/2014   HGBA1C 6.4 09/20/2014   10/11/2013: 7.6% 06/13/2013: 7.3%  Pt was on an insulin pump: Medtronic Minimed up to 2011 >> developed DKA >> did not restart after this, but he is not interested to restart.  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   Pt checks his sugars 4x a day and they are: - am: 150-180 >> 98-208 >> 82-140, some 200s >> 87-145, 165, 207 >> 120-150, 180, 260 >> 98, 105-189, 220 - 2h after b'fast: n/c - before lunch: 120-150 >> 90, 118-208, 221 >> 87-175, 221 >> 88-155, 180, 201 >> 150-220 >> 108-155, 187, 230 - 2h after lunch: n/c - before dinner:  95-235 >> 95-182, 210 >> 82, 107-147, 195 >> 80-150 >> 95-163, 210 - 2h after dinner: n/c - bedtime: 140-170, 200 rarely >>141-241 >> 82-163, 181 >> 95-163, 200 >> 80-160 >> 95-179, 221 - nighttime:  No more lows. Lowest sugar was 75; he has hypoglycemia awareness at 50s. No previous hypoglycemia admission. Does have a glucagon kit at home. Highest sugar was 400s >> 260 + previous DKA admission - 1x: 2011.    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  Pt's meals are: - Breakfast: (Usually takes 11 units of NovoLog): oatmeal or bacon and eggs + cheese biscuit - Lunch: (12-13 units): sandwich or  hamburger - Dinner: (11-12 units): grilled chicken and rice; spaghetti; sloppy joes - Snacks:PB crackers at 10 am; cheeze its - boluses 3-4 units   - no CKD, last BUN/creatinine:  Lab Results  Component Value Date   BUN 9 04/09/2014   CREATININE 1.2 04/09/2014   No urinary protein at last check: Component     Latest Ref Rng 04/09/2014  Microalb, Ur     0.0 - 1.9 mg/dL 4.0 (H)  Creatinine,U      198.0  MICROALB/CREAT RATIO     0.0 - 30.0 mg/g 2.0   - Latest lipid panel: Lab Results  Component Value Date   CHOL 125 04/09/2014   HDL 36.70* 04/09/2014   LDLCALC 71 04/09/2014   TRIG 88.0 04/09/2014   CHOLHDL 3 04/09/2014   - Last TSH level: Lab Results  Component Value Date   TSH 0.83 04/09/2014   - last eye exam was in 2014. No DR.  - no numbness and tingling in his feet.  ROS: Constitutional: no weight gain/loss, no fatigue, no subjective hyperthermia/hypothermia Eyes: no blurry vision, no xerophthalmia ENT: no sore throat, no nodules palpated in throat, no dysphagia/odynophagia, no hoarseness Cardiovascular: no CP/SOB/palpitations/leg swelling Respiratory: no cough/SOB Gastrointestinal: no N/V/D/C Musculoskeletal: no muscle/joint aches Skin: no rashes Neurological: no  tremors/numbness/tingling/dizziness  I reviewed pt's medications, allergies, PMH, social hx, family hx, and changes were documented in the history of present illness. Otherwise, unchanged from my initial visit note:  Past Medical History  Diagnosis Date  . Diabetes mellitus without complication ([place])     Type I    Past Surgical History  Procedure Laterality Date  . Neck surgery  07/2011   History   Social History  . Marital Status:  married     Spouse Name: N/A    Number of Children: 1   Occupational History  .  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 Smoking  status: Never Smoker   . Smokeless tobacco: Current User    Types: Chew  . Alcohol Use: 0.0 oz/week    0 Not specified per week  . Drug Use: No   Social History Narrative   Married   1 stepson   No Known Allergies  Family history: None  PE: BP 118/60 mmHg  Pulse 83  Temp(Src) 98.2 F (36.8 C) (Oral)  Resp 12  Wt 156 lb 9.6 oz (71.033 kg)  SpO2 97% Wt Readings from Last 3 Encounters:  03/25/15 156 lb 9.6 oz (71.033 kg)  12/24/14 146 lb 12.8 oz (66.588 kg)  09/20/14 143 lb (64.864 kg)   Constitutional: Normal, in NAD Eyes: PERRLA, EOMI, no exophthalmos ENT: moist mucous membranes, no thyromegaly, no cervical lymphadenopathy Cardiovascular: RRR, No MRG Respiratory: CTA B Gastrointestinal: abdomen soft, NT, ND, BS+ Musculoskeletal: no deformities, strength intact in all 4 Skin: moist, warm, no rashes Neurological: no tremor with outstretched hands, DTR normal in all 4  ASSESSMENT: 1. DM1, uncontrolled, without complications - I reviewed records from Dr. Gabriel Carina, patient's diabetes was never very uncontrolled, his range is 7.2-7.6%. I suspect that he still has some insulin production from his pancreas. Since he is not interested in the insulin pump right now, I did not check a C-peptide.  PLAN:  1. Patient with long-standing, fairly well controlled DM1, on basal-bolus insulin therapy. His sugars are still fluctuating, but mostly high in am >> will increase Lantus a little. Also, I advised him to decrease his target CBG if sugars still higher than target after increasing Lantus. - I advised him to:  Patient Instructions  Please increase Lantus to 35 units in am. If the sugars before meals are not 80-130, please decrease your target from 120 to 110.   For now, continue: - Breakfast: ICR 1:13, target 120, ISF 30  - Lunch: ICR: 1:13, target 120, ISF 30  - Dinner: ICR 1:15; target 120, ISF 30   Please stop at the lab.  Please come back for a follow-up appointment in 3  months.  - Continue checking sugars at different times of the day - check 4 times a day, rotating checks >> he does an excellent job with this and his log is exemplary! - advised for yearly eye exams >> he is due! >> will have one in 04/2015 - no signs of other autoimmune disorders  - will check: Orders Placed This Encounter  Procedures  . Lipid Panel  . Microalbumin / creatinine urine ratio  . Comprehensive metabolic panel  . TSH  - check HbA1c today >> 6.2% (much better) - Return to clinic in 3 mo with sugar log   Orders Only on 03/25/2015  Component Date Value  Ref Range Status  . Hemoglobin A1C 03/25/2015 6.2   Final  Office Visit on 03/25/2015  Component Date Value Ref Range Status  . Cholesterol 03/25/2015 133  0 - 200 mg/dL Final   ATP III Classification       Desirable:  < 200 mg/dL               Borderline High:  200 - 239 mg/dL          High:  > = 240 mg/dL  . Triglycerides 03/25/2015 178.0* 0.0 - 149.0 mg/dL Final   Normal:  <150 mg/dLBorderline High:  150 - 199 mg/dL  . HDL 03/25/2015 37.50* >39.00 mg/dL Final  . VLDL 03/25/2015 35.6  0.0 - 40.0 mg/dL Final  . LDL Cholesterol 03/25/2015 60  0 - 99 mg/dL Final  . Total CHOL/HDL Ratio 03/25/2015 4   Final                  Men          Women1/2 Average Risk     3.4          3.3Average Risk          5.0          4.42X Average Risk          9.6          7.13X Average Risk          15.0          11.0                      . NonHDL 03/25/2015 95.35   Final   NOTE:  Non-HDL goal should be 30 mg/dL higher than patient's LDL goal (i.e. LDL goal of < 70 mg/dL, would have non-HDL goal of < 100 mg/dL)  . Microalb, Ur 03/25/2015 1.3  0.0 - 1.9 mg/dL Final  . Creatinine,U 03/25/2015 192.0   Final  . Microalb Creat Ratio 03/25/2015 0.7  0.0 - 30.0 mg/g Final  . Sodium 03/25/2015 141  135 - 145 mEq/L Final  . Potassium 03/25/2015 4.4  3.5 - 5.1 mEq/L Final  . Chloride 03/25/2015 104  96 - 112 mEq/L Final  . CO2 03/25/2015 29  19 - 32  mEq/L Final  . Glucose, Bld 03/25/2015 105* 70 - 99 mg/dL Final  . BUN 03/25/2015 13  6 - 23 mg/dL Final  . Creatinine, Ser 03/25/2015 1.59* 0.40 - 1.50 mg/dL Final  . Total Bilirubin 03/25/2015 0.6  0.2 - 1.2 mg/dL Final  . Alkaline Phosphatase 03/25/2015 67  39 - 117 U/L Final  . AST 03/25/2015 17  0 - 37 U/L Final  . ALT 03/25/2015 13  0 - 53 U/L Final  . Total Protein 03/25/2015 7.4  6.0 - 8.3 g/dL Final  . Albumin 03/25/2015 4.5  3.5 - 5.2 g/dL Final  . Calcium 03/25/2015 9.9  8.4 - 10.5 mg/dL Final  . GFR 03/25/2015 54.44* >60.00 mL/min Final  . TSH 03/25/2015 1.40  0.35 - 4.50 uIU/mL Final   TSH normal.  GFR lower >> will recheck at next visit. ACR not elevated. Lipids close to goal.

## 2015-03-25 NOTE — Patient Instructions (Signed)
Please increase Lantus to 35 units in am. If the sugars before meals are not 80-130, please decrease your target from 120 to 110.   For now, continue: - Breakfast: ICR 1:13, target 120, ISF 30  - Lunch: ICR: 1:13, target 120, ISF 30  - Dinner: ICR 1:15; target 120, ISF 30   Please stop at the lab.  Please come back for a follow-up appointment in 3 months.

## 2015-03-26 LAB — COMPREHENSIVE METABOLIC PANEL
ALK PHOS: 67 U/L (ref 39–117)
ALT: 13 U/L (ref 0–53)
AST: 17 U/L (ref 0–37)
Albumin: 4.5 g/dL (ref 3.5–5.2)
BUN: 13 mg/dL (ref 6–23)
CHLORIDE: 104 meq/L (ref 96–112)
CO2: 29 meq/L (ref 19–32)
Calcium: 9.9 mg/dL (ref 8.4–10.5)
Creatinine, Ser: 1.59 mg/dL — ABNORMAL HIGH (ref 0.40–1.50)
GFR: 54.44 mL/min — AB (ref >60.00)
GLUCOSE: 105 mg/dL — AB (ref 70–99)
POTASSIUM: 4.4 meq/L (ref 3.5–5.1)
Sodium: 141 mEq/L (ref 135–145)
TOTAL PROTEIN: 7.4 g/dL (ref 6.0–8.3)
Total Bilirubin: 0.6 mg/dL (ref 0.2–1.2)

## 2015-03-26 LAB — TSH: TSH: 1.4 u[IU]/mL (ref 0.35–4.50)

## 2015-03-26 LAB — MICROALBUMIN / CREATININE URINE RATIO
Creatinine,U: 192 mg/dL
MICROALB UR: 1.3 mg/dL (ref 0.0–1.9)
Microalb Creat Ratio: 0.7 mg/g (ref 0.0–30.0)

## 2015-03-26 LAB — LIPID PANEL
CHOLESTEROL: 133 mg/dL (ref 0–200)
HDL: 37.5 mg/dL — ABNORMAL LOW (ref >39.00)
LDL CALC: 60 mg/dL (ref 0–99)
NonHDL: 95.35
TRIGLYCERIDES: 178 mg/dL — AB (ref 0.0–149.0)
Total CHOL/HDL Ratio: 4
VLDL: 35.6 mg/dL (ref 0.0–40.0)

## 2015-06-03 LAB — HM DIABETES EYE EXAM

## 2015-06-23 ENCOUNTER — Ambulatory Visit (INDEPENDENT_AMBULATORY_CARE_PROVIDER_SITE_OTHER): Payer: Managed Care, Other (non HMO) | Admitting: Internal Medicine

## 2015-06-23 ENCOUNTER — Other Ambulatory Visit (INDEPENDENT_AMBULATORY_CARE_PROVIDER_SITE_OTHER): Payer: Managed Care, Other (non HMO) | Admitting: *Deleted

## 2015-06-23 ENCOUNTER — Encounter: Payer: Self-pay | Admitting: Internal Medicine

## 2015-06-23 DIAGNOSIS — IMO0001 Reserved for inherently not codable concepts without codable children: Secondary | ICD-10-CM

## 2015-06-23 DIAGNOSIS — E103299 Type 1 diabetes mellitus with mild nonproliferative diabetic retinopathy without macular edema, unspecified eye: Secondary | ICD-10-CM | POA: Insufficient documentation

## 2015-06-23 DIAGNOSIS — E109 Type 1 diabetes mellitus without complications: Secondary | ICD-10-CM

## 2015-06-23 DIAGNOSIS — E1065 Type 1 diabetes mellitus with hyperglycemia: Principal | ICD-10-CM

## 2015-06-23 LAB — BASIC METABOLIC PANEL WITH GFR
BUN: 13 mg/dL (ref 7–25)
CALCIUM: 9.4 mg/dL (ref 8.6–10.3)
CO2: 27 mmol/L (ref 20–31)
CREATININE: 1.37 mg/dL — AB (ref 0.60–1.35)
Chloride: 104 mmol/L (ref 98–110)
GFR, EST AFRICAN AMERICAN: 79 mL/min (ref >=60)
GFR, EST NON AFRICAN AMERICAN: 69 mL/min (ref >=60)
Glucose, Bld: 108 mg/dL — ABNORMAL HIGH (ref 65–99)
Potassium: 4.2 mmol/L (ref 3.5–5.3)
SODIUM: 139 mmol/L (ref 135–146)

## 2015-06-23 LAB — POCT GLYCOSYLATED HEMOGLOBIN (HGB A1C): Hemoglobin A1C: 7

## 2015-06-23 MED ORDER — INSULIN GLARGINE 100 UNIT/ML SOLOSTAR PEN: 35.0000 [IU] | PEN_INJECTOR | Freq: Every day | SUBCUTANEOUS | Status: DC

## 2015-06-23 MED ORDER — INSULIN ASPART 100 UNIT/ML FLEXPEN: PEN_INJECTOR | SUBCUTANEOUS | Status: DC

## 2015-06-23 NOTE — Patient Instructions (Signed)
Please continue: - Lantus 35 units in am  Change NovoLog as follows: - Breakfast: ICR 1:13, target 110-120, ISF 30  - Lunch: ICR: 1:13, target 110-120, ISF 30 - Dinner: ICR 1:15 >> 1:13; target 110-120, ISF 30   Please stop at the lab.  Please return in 3 months with your sugar log.

## 2015-06-23 NOTE — Progress Notes (Signed)
Patient ID: Brian Braun, male   DOB: 1985-10-24, 30 y.o.   MRN: [id number]   HPI: Brian Braun is a 30 y.o.-year-old male, returning for f/u for DM1, dx 1996, uncontrolled, without complications. Last visit 3 mo ago.  Starting this January >> goes to school and starts working nights (10 pm-8 am).   Last hemoglobin A1c was: Lab Results  Component Value Date   HGBA1C 6.2 03/25/2015   HGBA1C 7.3 12/24/2014   HGBA1C 6.4 09/20/2014  10/11/2013: 7.6% 06/13/2013: 7.3%  Pt was on an insulin pump: Medtronic Minimed up to 2011 >> developed DKA >> did not restart after this, but he is not interested to restart.  He is now on: - Lantus 35 units in am - NovoLog as follows: - Breakfast: ICR 1:13, target 110-120, ISF 30  - Lunch: ICR: 1:13, target 110-120, ISF 30 - Dinner: ICR 1:15; target 110-120, ISF 30  Ends up 36 units (10-12 per meal)  He has an Therapist, occupational.   Pt checks his sugars 4x a day and they are: - am: 82-140, some 200s >> 87-145, 165, 207 >> 120-150, 180, 260 >> 98, 105-189, 220 >> (bedtime): 120-160 - 2h after b'fast: n/c - before lunch: 118-208, 221 >> 87-175, 221 >> 88-155, 180, 201 >> 150-220 >> 108-155, 187, 230 >> (night): 150-200 - 2h after lunch: n/c - before dinner:  95-235 >> 95-182, 210 >> 82, 107-147, 195 >> 80-150 >> 95-163, 210 >> (evening) 80-130  - 2h after dinner: n/c - bedtime: 140-170, 200 rarely >>141-241 >> 82-163, 181 >> 95-163, 200 >> 80-160 >> 95-179, 221 >> n/c - nighttime:  No more lows. Lowest sugar was 75; he has hypoglycemia awareness at 50s. No previous hypoglycemia admission. Does have a glucagon kit at home. Highest sugar was 400s >> 260 + previous DKA admission - 1x: 2011.    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  Pt's meals are: - Breakfast: (Usually takes 11 units of NovoLog): oatmeal or bacon and eggs + cheese biscuit - Lunch: (12-13 units): sandwich  or hamburger - Dinner: (11-12 units): grilled chicken and rice; spaghetti; sloppy joes - Snacks:PB crackers at 10 am; cheeze it's - boluses 3-4 units   - no CKD, last BUN/creatinine:  Lab Results  Component Value Date   BUN 13 03/25/2015   CREATININE 1.59* 03/25/2015   No urinary protein at last check: Component     Latest Ref Rng 04/09/2014  Microalb, Ur     0.0 - 1.9 mg/dL 4.0 (H)  Creatinine,U      198.0  MICROALB/CREAT RATIO     0.0 - 30.0 mg/g 2.0   - Latest lipid panel: Lab Results  Component Value Date   CHOL 133 03/25/2015   HDL 37.50* 03/25/2015   LDLCALC 60 03/25/2015   TRIG 178.0* 03/25/2015   CHOLHDL 4 03/25/2015   - Last TSH level: Lab Results  Component Value Date   TSH 1.40 03/25/2015   - last eye exam was in 05/2015. No DR.  - no numbness and tingling in his feet.  ROS: Constitutional: no weight gain/loss, no fatigue, no subjective hyperthermia/hypothermia Eyes: no blurry vision, no xerophthalmia ENT: no sore throat, no nodules palpated in throat, no dysphagia/odynophagia, no hoarseness Cardiovascular: no CP/SOB/palpitations/leg swelling Respiratory: no cough/SOB Gastrointestinal: no N/V/D/C Musculoskeletal: no muscle/joint aches Skin: no rashes Neurological: no tremors/numbness/tingling/dizziness  I reviewed pt's medications,  allergies, PMH, social hx, family hx, and changes were documented in the history of present illness. Otherwise, unchanged from my initial visit note:  Past Medical History  Diagnosis Date  . Diabetes mellitus without complication ([place])     Type I    Past Surgical History  Procedure Laterality Date  . Neck surgery  07/2011   History   Social History  . Marital Status:  married     Spouse Name: N/A    Number of Children: 1   Occupational History  .  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 Smoking status: Never Smoker   . Smokeless tobacco: Current User    Types: Chew  . Alcohol Use: 0.0 oz/week    0 Not specified per week  . Drug Use: No   Social History Narrative   Married   1 stepson   No Known Allergies  Family history: None  PE: BP 110/68 mmHg  Pulse 95  Temp(Src) 97.9 F (36.6 C) (Oral)  Resp 12  Wt 160 lb (72.576 kg)  SpO2 97% Wt Readings from Last 3 Encounters:  06/23/15 160 lb (72.576 kg)  03/25/15 156 lb 9.6 oz (71.033 kg)  12/24/14 146 lb 12.8 oz (66.588 kg)   Constitutional: Normal, in NAD Eyes: PERRLA, EOMI, no exophthalmos ENT: moist mucous membranes, no thyromegaly, no cervical lymphadenopathy Cardiovascular: RRR, No MRG Respiratory: CTA B Gastrointestinal: abdomen soft, NT, ND, BS+ Musculoskeletal: no deformities, strength intact in all 4 Skin: moist, warm, no rashes Neurological: no tremor with outstretched hands, DTR normal in all 4  ASSESSMENT: 1. DM1, uncontrolled, without complications - I reviewed records from Dr. Gabriel Carina, patient's diabetes was never very uncontrolled, his range is 7.2-7.6%. I suspect that he still has some insulin production from his pancreas. Since he is not interested in the insulin pump right now, I did not check a C-peptide.  PLAN:  1. Patient with long-standing, fairly well controlled DM1, on basal-bolus insulin therapy. His sugars are higher before lunch, which are actually after his nighttime meal  >> will decrease ICR with this meal. - I advised him to:  Patient Instructions  Please continue: - Lantus 35 units in am  Change NovoLog as follows: - Breakfast: ICR 1:13, target 110-120, ISF 30  - Lunch: ICR: 1:13, target 110-120, ISF 30 - Dinner: ICR 1:15 >> 1:13; target 110-120, ISF 30   Please stop at the lab.  Please return in 3 months with your sugar log.   - Continue checking sugars at different times of the day - check 4 times a day, rotating checks >> he does an excellent job with this -  advised for yearly eye exams >> last was 04/2015 - no signs of other autoimmune disorders  - will repeat a GFR as last was low - No PCP! - check HbA1c today >> 7% (higher, but still good) - Return to clinic in 3 mo with sugar log

## 2015-07-09 ENCOUNTER — Encounter: Payer: Self-pay | Admitting: Internal Medicine

## 2015-09-23 ENCOUNTER — Encounter: Payer: Self-pay | Admitting: Internal Medicine

## 2015-09-23 ENCOUNTER — Other Ambulatory Visit (INDEPENDENT_AMBULATORY_CARE_PROVIDER_SITE_OTHER): Payer: Managed Care, Other (non HMO) | Admitting: *Deleted

## 2015-09-23 ENCOUNTER — Ambulatory Visit (INDEPENDENT_AMBULATORY_CARE_PROVIDER_SITE_OTHER): Payer: Managed Care, Other (non HMO) | Admitting: Internal Medicine

## 2015-09-23 VITALS — BP 112/64 | HR 86 | Temp 98.0°F | Resp 12 | Wt 169.0 lb

## 2015-09-23 DIAGNOSIS — R635 Abnormal weight gain: Secondary | ICD-10-CM | POA: Diagnosis not present

## 2015-09-23 DIAGNOSIS — E119 Type 2 diabetes mellitus without complications: Secondary | ICD-10-CM | POA: Diagnosis not present

## 2015-09-23 DIAGNOSIS — E109 Type 1 diabetes mellitus without complications: Secondary | ICD-10-CM

## 2015-09-23 DIAGNOSIS — E1065 Type 1 diabetes mellitus with hyperglycemia: Principal | ICD-10-CM

## 2015-09-23 DIAGNOSIS — IMO0001 Reserved for inherently not codable concepts without codable children: Secondary | ICD-10-CM

## 2015-09-23 LAB — POCT GLYCOSYLATED HEMOGLOBIN (HGB A1C): Hemoglobin A1C: 6.4

## 2015-09-23 NOTE — Progress Notes (Signed)
Patient ID: Brian Braun, male   DOB: 1986-02-09, 30 y.o.   MRN: [id number]   HPI: Brian Braun is a 30 y.o.-year-old male, returning for f/u for DM1, dx 1996, uncontrolled, without complications. Last visit 3 mo ago.  He is working nights: 10 pm to 8 am.   Last hemoglobin A1c was: Lab Results  Component Value Date   HGBA1C 7.0 06/23/2015   HGBA1C 6.2 03/25/2015   HGBA1C 7.3 12/24/2014  10/11/2013: 7.6% 06/13/2013: 7.3%  Pt was on an insulin pump: Medtronic Minimed up to 2011 >> developed DKA >> did not restart after this, but he is not interested to restart.  He is now on: - Lantus 35 units in am - NovoLog as follows: - Breakfast: ICR 1:13, target 110-120, ISF 30  - Lunch: ICR: 1:13, target 110-120, ISF 30  - Dinner: ICR 1:15 >> 1:13; target 110-120, ISF 30  Ends up 36 units (10-12 per meal)  He has an Therapist, occupational.   Pt checks his sugars 4x a day and they are: - am: 87-145, 165, 207 >> 120-150, 180, 260 >> 98, 105-189, 220 >> (before his dinner): 120-160 >> 120-130 - 2h after b'fast: n/c - before lunch: 88-155, 180, 201 >> 150-220 >> 108-155, 187, 230 >> (before his lunch- 3 pm: 150-200 >> 160s - 2h after lunch: n/c - before dinner: 95-182, 210 >> 82, 107-147, 195 >> 80-150 >> 95-163, 210 >> (evening) 80-130 >> 100-130 - 2h after dinner: n/c - bedtime: 141-241 >> 82-163, 181 >> 95-163, 200 >> 80-160 >> 95-179, 221 >> n/c - nighttime: before eating - 140-150s No more lows. Lowest sugar was 75 >> 50s 1-2x (at the beach, after alcohol); he has hypoglycemia awareness at 50s. No previous hypoglycemia admission. Does have a glucagon kit at home. Highest sugar was 400s >> 260 >> 240 1-2x; + previous DKA admission - 1x: 2011.    He is trying to keep his sugars higher at night when he is sleeping - as he is alone at home.  Pt's meals are: - Breakfast: (Usually takes 11 units of NovoLog): oatmeal or bacon and eggs + cheese biscuit - Lunch: (12-13 units): sandwich or hamburger -  Dinner: (11-12 units): grilled chicken and rice; spaghetti; sloppy joes - Snacks:PB crackers at 10 am; cheeze it's - boluses 3-4 units   - no CKD, last BUN/creatinine:  Lab Results  Component Value Date   BUN 13 06/23/2015   CREATININE 1.37* 06/23/2015   No urinary protein at last check: Component     Latest Ref Rng 04/09/2014  Microalb, Ur     0.0 - 1.9 mg/dL 4.0 (H)  Creatinine,U      198.0  MICROALB/CREAT RATIO     0.0 - 30.0 mg/g 2.0   - Latest lipid panel: Lab Results  Component Value Date   CHOL 133 03/25/2015   HDL 37.50* 03/25/2015   LDLCALC 60 03/25/2015   TRIG 178.0* 03/25/2015   CHOLHDL 4 03/25/2015   - Last TSH level: Lab Results  Component Value Date   TSH 1.40 03/25/2015   - last eye exam was in 05/2015. No DR.  - no numbness and tingling in his feet.  ROS: Constitutional: + weight gain, no fatigue, no subjective hyperthermia/hypothermia Eyes: no blurry vision, no xerophthalmia ENT: no sore throat, no nodules palpated in throat, no dysphagia/odynophagia, no hoarseness Cardiovascular: no CP/SOB/palpitations/leg swelling Respiratory: no cough/SOB Gastrointestinal: no N/V/D/C Musculoskeletal: no muscle/joint aches Skin: no rashes Neurological: no tremors/numbness/tingling/dizziness  I reviewed pt's  medications, allergies, PMH, social hx, family hx, and changes were documented in the history of present illness. Otherwise, unchanged from my initial visit note:  Past Medical History  Diagnosis Date  . Diabetes mellitus without complication ([place])     Type I    Past Surgical History  Procedure Laterality Date  . Neck surgery  07/2011   History   Social History  . Marital Status:  married     Spouse Name: N/A    Number of Children: 1   Occupational History  .  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 Smoking status:  Never Smoker   . Smokeless tobacco: Current User    Types: Chew  . Alcohol Use: 0.0 oz/week    0 Not specified per week  . Drug Use: No   Social History Narrative   Married   1 stepson   No Known Allergies  Family history: None  PE: BP 112/64 mmHg  Pulse 86  Temp(Src) 98 F (36.7 C) (Oral)  Resp 12  Wt 169 lb (76.658 kg)  SpO2 97% Body mass index is 27.29 kg/(m^2). Wt Readings from Last 3 Encounters:  09/23/15 169 lb (76.658 kg)  06/23/15 160 lb (72.576 kg)  03/25/15 156 lb 9.6 oz (71.033 kg)   Constitutional: Normal, in NAD Eyes: PERRLA, EOMI, no exophthalmos ENT: moist mucous membranes, no thyromegaly, no cervical lymphadenopathy Cardiovascular: RRR, No MRG Respiratory: CTA B Gastrointestinal: abdomen soft, NT, ND, BS+ Musculoskeletal: no deformities, strength intact in all 4 Skin: moist, warm, no rashes Neurological: no tremor with outstretched hands, DTR normal in all 4  ASSESSMENT: 1. DM1, uncontrolled, without complications - I reviewed records from Dr. Gabriel Carina, patient's diabetes was never very uncontrolled, his range is 7.2-7.6%. I suspect that he still has some insulin production from his pancreas. Since he is not interested in the insulin pump right now, I did not check a C-peptide.  2. Weight gain  PLAN:  1. Patient with long-standing, fairly well controlled DM1, on basal-bolus insulin therapy.  - I advised him to:  Patient Instructions  Please continue: - Lantus 35 units in am - NovoLog as follows: - Breakfast: ICR 1:13, target 110-120, ISF 30  - Lunch: ICR: 1:13, target 110-120, ISF 30 - Dinner: ICR 1:13; target 110-120, ISF 30   Please return in 3 months with your sugar log.   Please try to use the myfitnesspal app.   - Continue checking sugars at different times of the day - check 4 times a day, rotating checks >> he does an excellent job with this - advised for yearly eye exams >> last was 04/2015, he is UTD - no signs of other autoimmune  disorders  - will start a PA for his AccuChek Expert meter as the strips are not covered by insurance anymore... - check HbA1c today >> 6.4% (improved, at goal) - Return to clinic in 3 mo with sugar log   2. Weight gain - after starting night shift - discussed abou, uncontrolled, without complications. Last visit 3 mo ago.  He is working nights: 10 pm to 8 am.   Last hemoglobin A1c was: Lab Results  Component Value Date   HGBA1C 7.0 06/23/2015   HGBA1C 6.2 03/25/2015   HGBA1C 7.3 12/24/2014  10/11/2013: 7.6% 06/13/2013: 7.3%  Pt was on an insulin pump: Medtronic Minimed up to 2011 >> developed DKA >> did not restart after this, but he is not interested to restart.  He is now on: - Lantus 35 units in am - NovoLog as follows: - Breakfast: ICR 1:13, target 110-120, ISF 30  - Lunch: ICR: 1:13, target 110-120, ISF 30 - Dinner: ICR 1:15 >> 1:13; target 110-120, ISF 30  Ends up 36 units (10-12 per meal)  He has an Therapist, occupational.   Pt checks his sugars 4x a day and they are: - am: 87-145, 165, 207 >> 120-150, 180, 260 >> 98, 105-189, 220 >> (before his dinner): 120-160 >> 120-130 - 2h after b'fast: n/c - before lunch: 88-155, 180, 201 >> 150-220 >> 108-155, 187, 230 >> (before his lunch- 3 pm: 150-200 >> 160s - 2h after lunch: n/c - before dinner: 95-182, 210 >> 82, 107-147, 195 >> 80-150 >> 95-163, 210 >> (evening) 80-130 >> 100-130 - 2h after dinner: n/c - bedtime: 141-241 >> 82-163, 181 >> 95-163, 200 >> 80-160 >> 95-179, 221 >> n/c - nighttime: before eating - 140-150s No more lows. Lowest sugar was 75 >> 50s 1-2x (at the beach, after alcohol); he has hypoglycemia awareness at 50s. No previous hypoglycemia admission. Does have a glucagon kit at home. Highest sugar was 400s >> 260 >> 240 1-2x; + previous DKA admission - 1x: 2011.    He is trying to keep his sugars higher at night when he is sleeping - as he is alone at home.  Pt's meals are: - Breakfast: (Usually takes 11 units of NovoLog): oatmeal or bacon and eggs + cheese biscuit - Lunch: (12-13 units): sandwich or hamburger -  Dinner: (11-12 units): grilled chicken and rice; spaghetti; sloppy joes - Snacks:PB crackers at 10 am; cheeze it's - boluses 3-4 units   - no CKD, last BUN/creatinine:  Lab Results  Component Value Date   BUN 13 06/23/2015   CREATININE 1.37* 06/23/2015   No urinary protein at last check: Component     Latest Ref Rng 04/09/2014  Microalb, Ur     0.0 - 1.9 mg/dL 4.0 (H)  Creatinine,U      198.0  MICROALB/CREAT RATIO     0.0 - 30.0 mg/g 2.0   - Latest lipid panel: Lab Results  Component Value Date   CHOL 133 03/25/2015   HDL 37.50* 03/25/2015   LDLCALC 60 03/25/2015   TRIG 178.0* 03/25/2015   CHOLHDL 4 03/25/2015   - Last TSH level: Lab Results  Component Value Date   TSH 1.40 03/25/2015   - last eye exam was in 05/2015. No DR.  - no numbness and tingling in his feet.  ROS: Constitutional: + weight gain, no fatigue, no subjective hyperthermia/hypothermia Eyes: no blurry vision, no xerophthalmia ENT: no sore throat, no nodules palpated in throat, no dysphagia/odynophagia, no hoarseness Cardiovascular: no CP/SOB/palpitations/leg swelling Respiratory: no cough/SOB Gastrointestinal: no N/V/D/C Musculoskeletal: no muscle/joint aches Skin: no rashes Neurological: no tremors/numbness/tingling/dizziness  I reviewed pt's  medications, allergies, PMH, social hx, family hx, and changes were documented in the history of present illness. Otherwise, unchanged from my initial visit note:  Past Medical History  Diagnosis Date  . Diabetes mellitus without complication ([place])     Type I    Past Surgical History  Procedure Laterality Date  . Neck surgery  07/2011   History   Social History  . Marital Status:  married     Spouse Name: N/A    Number of Children: 1   Occupational History  .  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 Smoking status:  Never Smoker   . Smokeless tobacco: Current User    Types: Chew  . Alcohol Use: 0.0 oz/week    0 Not specified per week  . Drug Use: No   Social History Narrative   Married   1 stepson   No Known Allergies  Family history: None  PE: BP 112/64 mmHg  Pulse 86  Temp(Src) 98 F (36.7 C) (Oral)  Resp 12  Wt 169 lb (76.658 kg)  SpO2 97% Body mass index is 27.29 kg/(m^2). Wt Readings from Last 3 Encounters:  09/23/15 169 lb (76.658 kg)  06/23/15 160 lb (72.576 kg)  03/25/15 156 lb 9.6 oz (71.033 kg)   Constitutional: Normal, in NAD Eyes: PERRLA, EOMI, no exophthalmos ENT: moist mucous membranes, no thyromegaly, no cervical lymphadenopathy Cardiovascular: RRR, No MRG Respiratory: CTA B Gastrointestinal: abdomen soft, NT, ND, BS+ Musculoskeletal: no deformities, strength intact in all 4 Skin: moist, warm, no rashes Neurological: no tremor with outstretched hands, DTR normal in all 4  ASSESSMENT: 1. DM1, uncontrolled, without complications - I reviewed records from Dr. Gabriel Carina, patient's diabetes was never very uncontrolled, his range is 7.2-7.6%. I suspect that he still has some insulin production from his pancreas. Since he is not interested in the insulin pump right now, I did not check a C-peptide.  2. Weight gain  PLAN:  1. Patient with long-standing, fairly well controlled DM1, on basal-bolus insulin therapy.  - I advised him to:  Patient Instructions  Please continue: - Lantus 35 units in am - NovoLog as follows: - Breakfast: ICR 1:13, target 110-120, ISF 30  - Lunch: ICR: 1:13, target 110-120, ISF 30 - Dinner: ICR 1:13; target 110-120, ISF 30   Please return in 3 months with your sugar log.   Please try to use the myfitnesspal app.   - Continue checking sugars at different times of the day - check 4 times a day, rotating checks >> he does an excellent job with this - advised for yearly eye exams >> last was 04/2015, he is UTD - no signs of other autoimmune  disorders  - will start a PA for his AccuChek Expert meter as the strips are not covered by insurance anymore... - check HbA1c today >> 6.4% (improved, at goal) - Return to clinic in 3 mo with sugar log   2. Weight gain - after starting night shift - discussed about improving diet - recommended the myfitnesspal phone app

## 2015-09-23 NOTE — Patient Instructions (Addendum)
Patient Instructions  Please continue: - Lantus 35 units in am - NovoLog as follows: - Breakfast: ICR 1:13, target 110-120, ISF 30  - Lunch: ICR: 1:13, target 110-120, ISF 30 - Dinner: ICR 1:13; target 110-120, ISF 30   Please return in 3 months with your sugar log.   Please try to use the myfitnesspal app.

## 2015-12-17 ENCOUNTER — Other Ambulatory Visit: Payer: Self-pay | Admitting: Internal Medicine

## 2015-12-25 ENCOUNTER — Other Ambulatory Visit: Payer: Self-pay

## 2015-12-25 MED ORDER — INSULIN GLARGINE 100 UNIT/ML SOLOSTAR PEN: PEN_INJECTOR | SUBCUTANEOUS | 0 refills | Status: DC

## 2015-12-29 ENCOUNTER — Encounter: Payer: Self-pay | Admitting: Internal Medicine

## 2015-12-29 ENCOUNTER — Ambulatory Visit (INDEPENDENT_AMBULATORY_CARE_PROVIDER_SITE_OTHER): Payer: Managed Care, Other (non HMO) | Admitting: Internal Medicine

## 2015-12-29 VITALS — BP 118/84 | HR 89 | Ht 67.5 in | Wt 170.0 lb

## 2015-12-29 DIAGNOSIS — R635 Abnormal weight gain: Secondary | ICD-10-CM

## 2015-12-29 DIAGNOSIS — E119 Type 2 diabetes mellitus without complications: Secondary | ICD-10-CM | POA: Diagnosis not present

## 2015-12-29 LAB — POCT GLYCOSYLATED HEMOGLOBIN (HGB A1C): Hemoglobin A1C: 7.2

## 2015-12-29 MED ORDER — INSULIN ASPART 100 UNIT/ML FLEXPEN: PEN_INJECTOR | SUBCUTANEOUS | 3 refills | Status: DC

## 2015-12-29 MED ORDER — INSULIN GLARGINE 100 UNIT/ML SOLOSTAR PEN: PEN_INJECTOR | SUBCUTANEOUS | 3 refills | Status: DC

## 2015-12-29 MED ORDER — INSULIN PEN NEEDLE 32G X 4 MM MISC: 3 refills | Status: DC

## 2015-12-29 NOTE — Progress Notes (Signed)
Patient ID: KITT LEDET, male   DOB: 1986-03-19, 30 y.o.   MRN: [id number]   HPI: [name] is a 30 y.o.-year-old male, returning for f/u for DM1, dx 1996, uncontrolled, without complications. Last visit 3 mo ago.  He was working nights: 10 pm to 8 am. From today, he starts working days!  He has congestion.   Last hemoglobin A1c was: Lab Results  Component Value Date   HGBA1C 6.4 09/23/2015   HGBA1C 7.0 06/23/2015   HGBA1C 6.2 03/25/2015  10/11/2013: 7.6% 06/13/2013: 7.3%  Pt was on an insulin pump: Medtronic Minimed up to 2011 >> developed DKA >> did not restart after this, but he is not interested to restart.  He is now on: - Lantus 35 units in am - NovoLog as follows: - Breakfast: ICR 1:13, target 110-120, ISF 30  - Lunch: ICR: 1:13, target 110-120, ISF 30 - Dinner: ICR 1:13; target 110-120, ISF 30  Ends up 36 units (10-12 per meal)  He has an Therapist, occupational.   Pt checks his sugars 4x a day and they are: - am: 120-150, 180, 260 >> 98, 105-189, 220 >> (before his dinner): 120-160 >> 120-130 >> (before dinner) 150-160 - 2h after b'fast: n/c - before lunch: 150-220 >> 108-155, 187, 230 >> (before his lunch- 3 pm: 150-200 >> 160s >> 3 am: 140-150 - 2h after lunch: n/c - before dinner: 82, 107-147, 195 >> 80-150 >> 95-163, 210 >> (evening) 80-130 >> 100-130 >> (before b'fast): 120-150 - 2h after dinner: n/c - bedtime: 141-241 >> 82-163, 181 >> 95-163, 200 >> 80-160 >> 95-179, 221 >> n/c - nighttime: before eating - 140-150s No more lows. Lowest sugar was 75 >> 50s 1-2x (at the beach, after alcohol) >> 90s; he has hypoglycemia awareness at 50s. No previous hypoglycemia admission. Does have a glucagon kit at home. Highest sugar was 400s >> 260 >> 240 1-2x >> 220 x1 a week; + previous DKA admission - 1x: 2011.    Pt's meals are: - Breakfast: (Usually takes 11 units of NovoLog): oatmeal or bacon and eggs + cheese biscuit - Lunch: (12-13 units): sandwich or hamburger - Dinner:  (11-12 units): grilled chicken and rice; spaghetti; sloppy joes - Snacks:PB crackers at 10 am; cheeze it's - boluses 3-4 units   - no CKD, last BUN/creatinine:  Lab Results  Component Value Date   BUN 13 06/23/2015   CREATININE 1.37 (H) 06/23/2015   Lab Results  Component Value Date   GFR 54.44 (L) 03/25/2015   GFR 78.07 04/09/2014   No urinary protein at last check: Lab Results  Component Value Date   MICRALBCREAT 0.7 03/25/2015   MICRALBCREAT 2.0 04/09/2014   - Latest lipid panel: Lab Results  Component Value Date   CHOL 133 03/25/2015   HDL 37.50 (L) 03/25/2015   LDLCALC 60 03/25/2015   TRIG 178.0 (H) 03/25/2015   CHOLHDL 4 03/25/2015   - Last TSH level: Lab Results  Component Value Date   TSH 1.40 03/25/2015   - last eye exam was in 05/2015. No DR.  - no numbness and tingling in his feet.  ROS: Constitutional: + weight gain, no fatigue, no subjective hyperthermia/hypothermia Eyes: no blurry vision, no xerophthalmia ENT: no sore throat, no nodules palpated in throat, no dysphagia/odynophagia, no hoarseness Cardiovascular: no CP/SOB/palpitations/leg swelling Respiratory: no cough/SOB Gastrointestinal: no N/V/D/C Musculoskeletal: no muscle/joint aches Skin: no rashes Neurological: no tremors/numbness/tingling/dizziness  I reviewed pt's medications, allergies, PMH, social hx, family hx, and changes were  documented in the history of present illness. Otherwise, unchanged from my initial visit note:  Past Medical History:  Diagnosis Date  . Diabetes mellitus without complication ([place])    Type I    Past Surgical History:  Procedure Laterality Date  . NECK SURGERY  07/2011   History   Social History  . Marital Status:  married     Spouse Name: N/A    Number of Children: 1   Occupational History  .  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 Smoking status: Never Smoker   . Smokeless tobacco: Current User    Types: Chew  . Alcohol Use: 0.0 oz/week    0 Not specified per week  . Drug Use: No   Social History Narrative   Married   1 stepson   No Known Allergies  Family history: None  PE: BP 118/84 (BP Location: Left Arm, Patient Position: Sitting)   Pulse 89   Ht 5' 7.5" (1.715 m)   Wt 170 lb (77.1 kg)   SpO2 96%   BMI 26.23 kg/m  Body mass index is 26.23 kg/m. Wt Readings from Last 3 Encounters:  12/29/15 170 lb (77.1 kg)  09/23/15 169 lb (76.7 kg)  06/23/15 160 lb (72.6 kg)   Constitutional: Normal, in NAD Eyes: PERRLA, EOMI, no exophthalmos ENT: moist mucous membranes, no thyromegaly, no cervical lymphadenopathy Cardiovascular: RRR, No MRG Respiratory: CTA B Gastrointestinal: abdomen soft, NT, ND, BS+ Musculoskeletal: no deformities, strength intact in all 4 Skin: moist, warm, no rashes Neurological: no tremor with outstretched hands, DTR normal in all 4  ASSESSMENT: 1. DM1, uncontrolled, without complications - Per  records from Dr. Gabriel Carina, patient's diabetes was never very uncontrolled, his range is 7.2-7.6%. I suspect that he still has some insulin production from his pancreas. Since he is not interested in the insulin pump right now, I did not check a C-peptide.  2. Weight gain  PLAN:  1. Patient with long-standing, fairly well controlled DM1, on basal-bolus insulin therapy. Since last visit, sugars are slightly higher, but he is now switching to daytime shift, which, I believe, will help. We will not change regimen today. - I advised him to:  Patient Instructions  Please continue: - Lantus 35 units in am - NovoLog as follows: - Breakfast: ICR 1:13, target 110-120, ISF 30  - Lunch: ICR: 1:13, target 110-120, ISF 30 - Dinner: ICR 1:13; target 110-120, ISF 30   Please return in 3 months with your sugar log.   - Continue checking sugars at different times of the day - check 4  times a day, rotating checks >> he does an excellent job with this - advised for yearly eye exams >> last was 04/2015, he is UTD - no signs of other autoimmune disorders  - check HbA1c today >> 7.2% (worse) - Return to clinic in 3 mo with sugar log. Will check annual labs then.  2. Weight gain - stable weight - after starting night shift - I believe this will improve in next few mo on day shift  Philemon Kingdom, MD PhD [facility] Endocrinology

## 2015-12-29 NOTE — Patient Instructions (Signed)
Patient Instructions  Please continue: - Lantus 35 units in am - NovoLog as follows: - Breakfast: ICR 1:13, target 110-120, ISF 30  - Lunch: ICR: 1:13, target 110-120, ISF 30 - Dinner: ICR 1:13; target 110-120, ISF 30   Please return in 3 months with your sugar log.

## 2016-03-29 ENCOUNTER — Encounter: Payer: Self-pay | Admitting: Internal Medicine

## 2016-03-29 ENCOUNTER — Ambulatory Visit (INDEPENDENT_AMBULATORY_CARE_PROVIDER_SITE_OTHER): Payer: Managed Care, Other (non HMO) | Admitting: Internal Medicine

## 2016-03-29 VITALS — BP 124/84 | HR 87 | Ht 65.0 in | Wt 161.6 lb

## 2016-03-29 DIAGNOSIS — R635 Abnormal weight gain: Secondary | ICD-10-CM

## 2016-03-29 DIAGNOSIS — E119 Type 2 diabetes mellitus without complications: Secondary | ICD-10-CM | POA: Diagnosis not present

## 2016-03-29 LAB — COMPLETE METABOLIC PANEL WITH GFR
ALBUMIN: 4.4 g/dL (ref 3.6–5.1)
ALK PHOS: 66 U/L (ref 40–115)
ALT: 12 U/L (ref 9–46)
AST: 15 U/L (ref 10–40)
BILIRUBIN TOTAL: 0.8 mg/dL (ref 0.2–1.2)
BUN: 10 mg/dL (ref 7–25)
CO2: 29 mmol/L (ref 20–31)
Calcium: 9.8 mg/dL (ref 8.6–10.3)
Chloride: 104 mmol/L (ref 98–110)
Creat: 1.25 mg/dL (ref 0.60–1.35)
GFR, EST AFRICAN AMERICAN: 88 mL/min (ref >=60)
GFR, EST NON AFRICAN AMERICAN: 76 mL/min (ref >=60)
Glucose, Bld: 80 mg/dL (ref 65–99)
Potassium: 4.6 mmol/L (ref 3.5–5.3)
Sodium: 139 mmol/L (ref 135–146)
TOTAL PROTEIN: 7.2 g/dL (ref 6.1–8.1)

## 2016-03-29 LAB — MICROALBUMIN / CREATININE URINE RATIO
Creatinine,U: 233.8 mg/dL
MICROALB/CREAT RATIO: 2.1 mg/g (ref 0.0–30.0)
Microalb, Ur: 5 mg/dL — ABNORMAL HIGH (ref 0.0–1.9)

## 2016-03-29 LAB — LIPID PANEL
CHOL/HDL RATIO: 3
Cholesterol: 135 mg/dL (ref 0–200)
HDL: 41 mg/dL (ref >39.00)
LDL Cholesterol: 83 mg/dL (ref 0–99)
NONHDL: 94.43
Triglycerides: 56 mg/dL (ref 0.0–149.0)
VLDL: 11.2 mg/dL (ref 0.0–40.0)

## 2016-03-29 LAB — TSH: TSH: 1.23 u[IU]/mL (ref 0.35–4.50)

## 2016-03-29 MED ORDER — INSULIN ASPART 100 UNIT/ML FLEXPEN: PEN_INJECTOR | SUBCUTANEOUS | 3 refills | Status: DC

## 2016-03-29 MED ORDER — GLUCOSE BLOOD VI STRP: ORAL_STRIP | 11 refills | Status: DC

## 2016-03-29 MED ORDER — BASAGLAR KWIKPEN 100 UNIT/ML ~~LOC~~ SOPN: 35.0000 [IU] | PEN_INJECTOR | Freq: Every day | SUBCUTANEOUS | 3 refills | Status: DC

## 2016-03-29 NOTE — Patient Instructions (Addendum)
Please continue: - Lantus/Basaglar 35 units in am - NovoLog as follows: - Breakfast: ICR 1:13, target 110-120, ISF 30  - Lunch: ICR: 1:13, target 110-120, ISF 30 - Dinner: ICR 1:13; target 110-120, ISF 30   Please stop at the lab.  Please return in 4 months with your sugar log.

## 2016-03-29 NOTE — Progress Notes (Signed)
Patient ID: Brian Braun, male   DOB: 17-Nov-1985, 30 y.o.   MRN: [id number]   HPI: Brian Braun is a 30 y.o.-year-old male, returning for f/u for DM1, dx 1996, uncontrolled, without complications. Last visit 3 mo ago.  Since last visit, he is working days. Sugars are better. He also lost 9 lbs!  Last hemoglobin A1c was: 03/25/2016: HbA1c 6.2% Lab Results  Component Value Date   HGBA1C 7.2 12/29/2015   HGBA1C 6.4 09/23/2015   HGBA1C 7.0 06/23/2015  10/11/2013: 7.6% 06/13/2013: 7.3%  Pt was on an insulin pump: Medtronic Minimed up to 2011 >> developed DKA >> did not restart after this, but he is not interested to restart.  He is now on: - Lantus 35 units in am - NovoLog as follows: - Breakfast: ICR 1:13, target 110-120, ISF 30  - Lunch: ICR: 1:13, target 110-120, ISF 30 - Dinner: ICR 1:13; target 110-120, ISF 30  Ends up 36 units (10-12 per meal)  He has an Therapist, occupational.   Pt checks his sugars 4x a day and they are: - am: (before his dinner): 120-160 >> 120-130 >> (before dinner) 150-160 >> 130-140 - 2h after b'fast: n/c - before lunch: (before his lunch- 3 pm: 150-200 >> 160s >> 3 am: 140-150 >> 150-160 (tries to keep it higher on purpose while at work) - 2h after lunch: n/c - before dinner: (evening) 80-130 >> 100-130 >> (before b'fast): 120-150 >> 115-120 - 2h after dinner: n/c - bedtime: 82-163, 181 >> 95-163, 200 >> 80-160 >> 95-179, 221 >> n/c >> 140 - nighttime: before eating - 140-150s No more lows. Lowest sugar was 75 >> 50s 1-2x (at the beach, after alcohol) >> 90s >> 85; he has hypoglycemia awareness at 50s.  No previous hypoglycemia admission. Does have a glucagon kit at home. Highest sugar was 400s >> 260 >> 240 1-2x >> 220 x1 a week >> 200;  He had a previous DKA admission - 1x: 2011.    Pt's meals are: - Breakfast: (Usually takes 11 units of NovoLog): oatmeal or bacon and eggs + cheese biscuit - Lunch: (12-13 units): sandwich or hamburger - Dinner: (11-12  units): grilled chicken and rice; spaghetti; sloppy joes - Snacks:PB crackers at 10 am; cheeze it's - boluses 3-4 units   - no CKD, last BUN/creatinine:  Lab Results  Component Value Date   BUN 13 06/23/2015   CREATININE 1.37 (H) 06/23/2015   Lab Results  Component Value Date   GFR 54.44 (L) 03/25/2015   GFR 78.07 04/09/2014   No urinary protein at last check: Lab Results  Component Value Date   MICRALBCREAT 0.7 03/25/2015   MICRALBCREAT 2.0 04/09/2014   - Latest lipid panel: Lab Results  Component Value Date   CHOL 133 03/25/2015   HDL 37.50 (L) 03/25/2015   LDLCALC 60 03/25/2015   TRIG 178.0 (H) 03/25/2015   CHOLHDL 4 03/25/2015   - Last TSH level: Lab Results  Component Value Date   TSH 1.40 03/25/2015   - last eye exam was in 05/2015. No DR.  - no numbness and tingling in his feet.  ROS: Constitutional: + weight loss, no fatigue, no subjective hyperthermia/hypothermia Eyes: no blurry vision, no xerophthalmia ENT: no sore throat, no nodules palpated in throat, no dysphagia/odynophagia, no hoarseness Cardiovascular: no CP/SOB/palpitations/leg swelling Respiratory: no cough/SOB Gastrointestinal: no N/V/D/C Musculoskeletal: no muscle/joint aches Skin: no rashes Neurological: no tremors/numbness/tingling/dizziness  I reviewed pt's medications, allergies, PMH, social hx, family hx, and changes were documented  in the history of present illness. Otherwise, unchanged from my initial visit note:  Past Medical History:  Diagnosis Date  . Diabetes mellitus without complication ([place])    Type I    Past Surgical History:  Procedure Laterality Date  . NECK SURGERY  07/2011   History   Social History  . Marital Status:  married     Spouse Name: N/A    Number of Children: 1   Occupational History  .  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 Smoking status: Never Smoker   . Smokeless tobacco: Current User    Types: Chew  . Alcohol Use: 0.0 oz/week    0 Not specified per week  . Drug Use: No   Social History Narrative   Married   1 stepson   No Known Allergies  Family history: None  PE: BP 124/84 (BP Location: Left Arm, Patient Position: Sitting, Cuff Size: Large)   Pulse 87   Ht '5\' 5"'  (1.651 m)   Wt 161 lb 9.6 oz (73.3 kg)   SpO2 98%   BMI 26.89 kg/m  Body mass index is 26.89 kg/m. Wt Readings from Last 3 Encounters:  03/29/16 161 lb 9.6 oz (73.3 kg)  12/29/15 170 lb (77.1 kg)  09/23/15 169 lb (76.7 kg)   Constitutional: Normal, in NAD Eyes: PERRLA, EOMI, no exophthalmos ENT: moist mucous membranes, no thyromegaly, no cervical lymphadenopathy Cardiovascular: RRR, No MRG Respiratory: CTA B Gastrointestinal: abdomen soft, NT, ND, BS+ Musculoskeletal: no deformities, strength intact in all 4 Skin: moist, warm, no rashes Neurological: no tremor with outstretched hands, DTR normal in all 4  ASSESSMENT: 1. DM1, uncontrolled, without complications - Per  records from Dr. Gabriel Carina, patient's diabetes was never very uncontrolled, his range is 7.2-7.6%. I suspect that he still has some insulin production from his pancreas. Since he is not interested in the insulin pump right now, I did not check a C-peptide.  2. Weight gain  PLAN:  1. Patient with long-standing, fairly well controlled DM1, on basal-bolus insulin therapy. Since last visit, sugars improved, as he switched to daytime shift. A recent HbA1c was 6.2% (much better!). We will not change regimen today, but switch Lantus to [employer] per insurance preference. - I advised him to:  Patient Instructions  Please continue: - Lantus 35 units in am - NovoLog as follows: - Breakfast: ICR 1:13, target 110-120, ISF 30  - Lunch: ICR: 1:13, target 110-120, ISF 30 - Dinner: ICR 1:13; target 110-120, ISF 30   Please return in 4 months with your sugar log.    - Continue checking sugars at different times of the day - check 4 times a day, rotating checks >> he does an excellent job with this - advised for yearly eye exams >> last was 04/2015, he is UTD - UTD with flu shot - no signs of other autoimmune disorders  - Will check annual labs now - Return to clinic in 4 mo with sugar log.   2. Weight gain - lost 9 lbs after starting dayshift!  Component     Latest Ref Rng & Units 03/29/2016  Sodium     135 - 146 mmol/L 139  Potassium     3.5 - 5.3 mmol/L 4.6  Chloride     98 - 110 mmol/L 104  CO2     20 -  31 mmol/L 29  Glucose     65 - 99 mg/dL 80  BUN     7 - 25 mg/dL 10  Creatinine     0.60 - 1.35 mg/dL 1.25  Total Bilirubin     0.2 - 1.2 mg/dL 0.8  Alkaline Phosphatase     40 - 115 U/L 66  AST     10 - 40 U/L 15  ALT     9 - 46 U/L 12  Total Protein     6.1 - 8.1 g/dL 7.2  Albumin     3.6 - 5.1 g/dL 4.4  Calcium     8.6 - 10.3 mg/dL 9.8  GFR, Est African American     >=60 mL/min 88  GFR, Est Non African American     >=60 mL/min 76  Cholesterol     0 - 200 mg/dL 135  Triglycerides     0.0 - 149.0 mg/dL 56.0  HDL Cholesterol     >39.00 mg/dL 41.00  VLDL     0.0 - 40.0 mg/dL 11.2  LDL (calc)     0 - 99 mg/dL 83  Total CHOL/HDL Ratio      3  NonHDL      94.43  Microalb, Ur     0.0 - 1.9 mg/dL 5.0 (H)  Creatinine,U     mg/dL 233.8  MICROALB/CREAT RATIO     0.0 - 30.0 mg/g 2.1  TSH     0.35 - 4.50 uIU/mL 1.23   Labs are great!  Philemon Kingdom, MD PhD [facility] Endocrinology

## 2016-03-30 ENCOUNTER — Telehealth: Payer: Self-pay

## 2016-03-30 NOTE — Telephone Encounter (Signed)
Called patient. Spoke to spouse. Gave lab results. Spouse verbalized understanding.   

## 2016-03-30 NOTE — Telephone Encounter (Signed)
-----   Message from Carlus Pavlovristina Gherghe, MD sent at 03/30/2016 11:55 AM EST ----- Raynelle FanningJulie, can you please call pt: Labs are great!

## 2016-04-01 ENCOUNTER — Other Ambulatory Visit: Payer: Self-pay

## 2016-04-01 MED ORDER — INSULIN DETEMIR 100 UNIT/ML FLEXPEN: 35.0000 [IU] | PEN_INJECTOR | Freq: Every day | SUBCUTANEOUS | 11 refills | Status: DC

## 2016-05-26 ENCOUNTER — Telehealth: Payer: Self-pay

## 2016-05-26 ENCOUNTER — Telehealth: Payer: Self-pay | Admitting: Internal Medicine

## 2016-05-26 NOTE — Telephone Encounter (Signed)
Pt requesting a call back from Dr. Elvera LennoxGherghe about his insulin, no further info given

## 2016-05-26 NOTE — Telephone Encounter (Signed)
Patient states insurance will no longer cover the novolog, that we have to switch to humalog. Okay to switch? Thank you!

## 2016-05-27 ENCOUNTER — Other Ambulatory Visit: Payer: Self-pay

## 2016-05-27 MED ORDER — INSULIN LISPRO 100 UNIT/ML (KWIKPEN): PEN_INJECTOR | SUBCUTANEOUS | 0 refills | Status: DC

## 2016-05-27 NOTE — Telephone Encounter (Signed)
Submitted

## 2016-05-27 NOTE — Telephone Encounter (Signed)
Ok - same doses.

## 2016-07-28 ENCOUNTER — Ambulatory Visit (INDEPENDENT_AMBULATORY_CARE_PROVIDER_SITE_OTHER): Payer: Managed Care, Other (non HMO) | Admitting: Internal Medicine

## 2016-07-28 ENCOUNTER — Encounter: Payer: Self-pay | Admitting: Internal Medicine

## 2016-07-28 VITALS — BP 118/82 | HR 86 | Ht 66.0 in | Wt 154.0 lb

## 2016-07-28 DIAGNOSIS — E1065 Type 1 diabetes mellitus with hyperglycemia: Secondary | ICD-10-CM | POA: Diagnosis not present

## 2016-07-28 DIAGNOSIS — IMO0001 Reserved for inherently not codable concepts without codable children: Secondary | ICD-10-CM

## 2016-07-28 LAB — POCT GLYCOSYLATED HEMOGLOBIN (HGB A1C): HEMOGLOBIN A1C: 8.7

## 2016-07-28 MED ORDER — INSULIN PEN NEEDLE 32G X 4 MM MISC: 3 refills | Status: DC

## 2016-07-28 MED ORDER — INSULIN DEGLUDEC 100 UNIT/ML ~~LOC~~ SOPN: 35.0000 [IU] | PEN_INJECTOR | Freq: Every day | SUBCUTANEOUS | 5 refills | Status: DC

## 2016-07-28 NOTE — Patient Instructions (Addendum)
Please stop Basaglar and Humalog and start: - Tresiba 35 units daily - Novolog as follows: - Breakfast: ICR 1:13, target 110-120, ISF 30  - Lunch: ICR: 1:13, target 110-120, ISF 30 - Dinner: ICR 1:13; target 110-120, ISF 30   Please return in 3 months with your sugar log.

## 2016-07-28 NOTE — Progress Notes (Signed)
Patient ID: Brian Braun, male   DOB: 1985-11-19, 31 y.o.   MRN: [id number]   HPI: Brian Braun is a 31 y.o.-year-old male, returning for f/u for DM1, dx 1996, uncontrolled, without complications. Last visit 4 mo ago.  Since last visit, sugars are higher.  Last hemoglobin A1c was: 03/25/2016: HbA1c 6.2% Lab Results  Component Value Date   HGBA1C 7.2 12/29/2015   HGBA1C 6.4 09/23/2015   HGBA1C 7.0 06/23/2015  10/11/2013: 7.6% 06/13/2013: 7.3%  Pt was on an insulin pump: Medtronic Minimed up to 2011 >> developed DKA >> did not restart after this, but he is not interested to restart.  He is now on: - Lantus 35 units in am - Humalog (had to change from NovoLog) as follows: - Breakfast: ICR 1:13, target 110-120, ISF 30  - Lunch: ICR: 1:13, target 110-120, ISF 30 - Dinner: ICR 1:13; target 110-120, ISF 30  Ends up 36 units (10-12 per meal)  He has an Therapist, occupational.   Pt checks his sugars 4x a day and they are: - am: 120-160 >> 120-130 >> (before dinner) 150-160 >> 130-140 >> 180-280 - 2h after b'fast: n/c - before lunch: 140-150 >> 150-160 (tries to keep it higher on purpose while at work) >> 140-170 - 2h after lunch: n/c - before dinner: 80-130 >> 100-130 >> (before b'fast): 120-150 >> 115-120 >> 160-200 - 2h after dinner: n/c - bedtime: 82-163, 181 >> 95-163, 200 >> 80-160 >> 95-179, 221 >> n/c >> 140 >> <200 - nighttime: before eating - 140-150s No more lows. Lowest sugar was 75 >> 50s 1-2x (at the beach, after alcohol) >> 90s >> 85 >> 100; he has hypoglycemia awareness at 50s.  No previous hypoglycemia admission. Does have a glucagon kit at home. Highest sugar was 400s >> 260 >> 240 1-2x >> 220 x1 a week >> 200 >> 300;  He had a previous DKA admission - 1x: 2011.    Pt's meals are: - Breakfast: (Usually takes 11 units of NovoLog): oatmeal or bacon and eggs + cheese biscuit - Lunch: (12-13 units): sandwich or hamburger - Dinner: (11-12 units): grilled chicken and rice;  spaghetti; sloppy joes - Snacks:PB crackers at 10 am; cheeze it's - boluses 3-4 units   - no CKD, last BUN/creatinine:  Lab Results  Component Value Date   BUN 10 03/29/2016   CREATININE 1.25 03/29/2016   Lab Results  Component Value Date   GFR 54.44 (L) 03/25/2015   GFR 78.07 04/09/2014   No urinary protein at last check: Lab Results  Component Value Date   MICRALBCREAT 2.1 03/29/2016   MICRALBCREAT 0.7 03/25/2015   MICRALBCREAT 2.0 04/09/2014   - Latest lipid panel: Lab Results  Component Value Date   CHOL 135 03/29/2016   HDL 41.00 03/29/2016   LDLCALC 83 03/29/2016   TRIG 56.0 03/29/2016   CHOLHDL 3 03/29/2016   - Last TSH level: Lab Results  Component Value Date   TSH 1.23 03/29/2016   - last eye exam was in 05/2015. No DR.  - no numbness and tingling in his feet.  ROS: Constitutional: + weight loss (more active at work, works outside), no fatigue, no subjective hyperthermia/hypothermia Eyes: no blurry vision, no xerophthalmia ENT: no sore throat, no nodules palpated in throat, no dysphagia/odynophagia, no hoarseness Cardiovascular: no CP/SOB/palpitations/leg swelling Respiratory: no cough/SOB Gastrointestinal: no N/V/D/C Musculoskeletal: no muscle/joint aches Skin: no rashes Neurological: no tremors/numbness/tingling/dizziness  I reviewed pt's medications, allergies, PMH, social hx, family hx, and changes were  documented in the history of present illness. Otherwise, unchanged from my initial visit note:  Past Medical History:  Diagnosis Date  . Diabetes mellitus without complication ([place])    Type I    Past Surgical History:  Procedure Laterality Date  . NECK SURGERY  07/2011   History   Social History  . Marital Status:  married     Spouse Name: N/A    Number of Children: 1   Occupational History  .  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 Smoking status: Never Smoker   . Smokeless tobacco: Current User    Types: Chew  . Alcohol Use: 0.0 oz/week    0 Not specified per week  . Drug Use: No   Social History Narrative   Married   1 stepson   No Known Allergies  Family history: None  PE: BP 118/82 (BP Location: Left Arm, Patient Position: Sitting)   Pulse 86   Ht 5' 6" (1.676 m)   Wt 154 lb (69.9 kg)   SpO2 97%   BMI 24.86 kg/m  Body mass index is 24.86 kg/m. Wt Readings from Last 3 Encounters:  07/28/16 154 lb (69.9 kg)  03/29/16 161 lb 9.6 oz (73.3 kg)  12/29/15 170 lb (77.1 kg)   Constitutional: Normal, in NAD Eyes: PERRLA, EOMI, no exophthalmos ENT: moist mucous membranes, no thyromegaly, no cervical lymphadenopathy Cardiovascular: RRR, No MRG Respiratory: CTA B Gastrointestinal: abdomen soft, NT, ND, BS+ Musculoskeletal: no deformities, strength intact in all 4 Skin: moist, warm, no rashes Neurological: no tremor with outstretched hands, DTR normal in all 4  ASSESSMENT: 1. DM1, uncontrolled, without complications - Per  records from Dr. Gabriel Carina, patient's diabetes was never very uncontrolled, his range is 7.2-7.6%. I suspect that he still has some insulin production from his pancreas. Since he is not interested in the insulin pump right now, I did not check a C-peptide.  PLAN:  1. Patient with long-standing, fairly well controlled DM1, on basal-bolus insulin therapy. Since last visit, sugars, sugars are much worse after changing the insulins per insurance coverage from Lantus & Novolog to Basaglar & Humalog >> HbA1c today: 8.7% (increased from 6.2%)! Will try to change to [place] (given discount card) and back to NovLog (will send a PA).  - I advised him to:  Patient Instructions  Please stop Basaglar and Humalog and start: - Tresiba 35 units daily - Novolog as follows: - Breakfast: ICR 1:13, target 110-120, ISF 30  - Lunch: ICR: 1:13, target 110-120, ISF 30 -  Dinner: ICR 1:13; target 110-120, ISF 30   Please return in 3 months with your sugar log.   - Continue checking sugars at different times of the day - check 4 times a day, rotating checks >> he does an excellent job with this - advised for yearly eye exams >> last was 04/2015, he needs a new one >> advised to schedule - UTD with flu shot - no signs of other autoimmune disorders  - reviewed his annual labs obtained at last visit with him >> great! - Return to clinic in 3 mo with sugar log.   Philemon Kingdom, MD PhD [facility] Endocrinology

## 2016-07-28 NOTE — Addendum Note (Signed)
Addended by: Darene Lamer T on: 07/28/2016 09:15 AM   Modules accepted: Orders

## 2016-08-11 ENCOUNTER — Other Ambulatory Visit: Payer: Self-pay

## 2016-08-11 MED ORDER — INSULIN ASPART 100 UNIT/ML FLEXPEN: PEN_INJECTOR | SUBCUTANEOUS | 3 refills | Status: DC

## 2016-11-01 ENCOUNTER — Other Ambulatory Visit: Payer: Self-pay

## 2016-11-01 ENCOUNTER — Telehealth: Payer: Self-pay | Admitting: Internal Medicine

## 2016-11-01 MED ORDER — INSULIN GLARGINE 100 UNIT/ML SOLOSTAR PEN: PEN_INJECTOR | SUBCUTANEOUS | 0 refills | Status: DC

## 2016-11-01 NOTE — Telephone Encounter (Signed)
Patient called to advise that his new insurance will now cover lantus. The tresiba he is currently on will cost over 500/ month. He is asking that [facility]. E states that e is out and needs by this afternoon.   Verified patient mobile.

## 2016-11-01 NOTE — Telephone Encounter (Signed)
OK, same dose 

## 2016-11-01 NOTE — Telephone Encounter (Signed)
Submitted to pharmacy 

## 2016-11-01 NOTE — Telephone Encounter (Signed)
Okay to submit in the Lantus?  Thank you!

## 2016-11-15 ENCOUNTER — Ambulatory Visit (INDEPENDENT_AMBULATORY_CARE_PROVIDER_SITE_OTHER): Payer: PRIVATE HEALTH INSURANCE | Admitting: Internal Medicine

## 2016-11-15 ENCOUNTER — Encounter: Payer: Self-pay | Admitting: Internal Medicine

## 2016-11-15 VITALS — BP 118/62 | HR 72 | Ht 66.5 in | Wt 166.0 lb

## 2016-11-15 DIAGNOSIS — E1065 Type 1 diabetes mellitus with hyperglycemia: Secondary | ICD-10-CM | POA: Diagnosis not present

## 2016-11-15 DIAGNOSIS — IMO0001 Reserved for inherently not codable concepts without codable children: Secondary | ICD-10-CM

## 2016-11-15 LAB — POCT GLYCOSYLATED HEMOGLOBIN (HGB A1C): HEMOGLOBIN A1C: 6.8

## 2016-11-15 MED ORDER — ACCU-CHEK SOFTCLIX LANCETS MISC: 11 refills | Status: AC

## 2016-11-15 NOTE — Addendum Note (Signed)
Addended by: Darene LamerHOMPSON, Hy Swiatek T on: 11/15/2016 09:51 AM   Modules accepted: Orders

## 2016-11-15 NOTE — Patient Instructions (Addendum)
Please continue - Lantus 35 units daily - Novolog as follows: - Breakfast: ICR 1:13, target 110-120, ISF 30  - Lunch: ICR: 1:13, target 110-120, ISF 30 - Dinner: ICR 1:13; target 110-120, ISF 30   Please return in 3-4 months with your sugar log.

## 2016-11-15 NOTE — Progress Notes (Signed)
Patient ID: Brian Braun, male   DOB: [birth year], 31 y.o.   MRN: [id number]   HPI: Brian Braun is a 31 y.o.-year-old male, returning for f/u for DM1, dx 1996, uncontrolled, without complications. Last visit 3.5 mo ago.  Last hemoglobin A1c was: Lab Results  Component Value Date   HGBA1C 8.7 07/28/2016   HGBA1C 7.2 12/29/2015   HGBA1C 6.4 09/23/2015  03/25/2016: HbA1c 6.2% 10/11/2013: 7.6% 06/13/2013: 7.3%  Pt was on an insulin pump: Medtronic Minimed up to 2011 >> developed DKA >> did not restart after this, He is NOT interested to restart. He is now on: - Tresiba Lantus 35 units in am - NovoLog as follows: - Breakfast: ICR 1:13, target 110-120, ISF 30  - Lunch: ICR: 1:13, target 110-120, ISF 30 - Dinner: ICR 1:13; target 110-120, ISF 30  Ends up 36 units (10-12 per meal)  He has an Therapist, occupational.   Pt checks his sugars 4x a day and they are better: - am: 120-160 >> 120-130 >> (before dinner) 150-160 >> 130-140 >> 180-280 >> 120-130 - 2h after b'fast: n/c - before lunch: 150-160 >> 140-170 >> 150-160 (on purpose while at work) - 2h after lunch: n/c - before dinner: 100-130 >> (before b'fast): 120-150 >> 115-120 >> 160-200 >> 130-140 - 2h after dinner: n/c - bedtime:  95-163, 200 >> 80-160 >> 95-179, 221 >> n/c >> 140 >> <200 >> 105-120 (after mowing) - nighttime: before eating - 140-150s >> n/c No more lows. Lowest sugar was 100 >> 65-70 x 1-2x since last visit  (after activity); he has hypoglycemia awareness at 6ss.  No previous hypoglycemia admission. Does have a glucagon kit at home. Highest sugar was 300 >> 200s;  He had a previous DKA admission - 1x: 2011.    Pt's meals are: - Breakfast: (Usually takes 11 units of NovoLog): oatmeal or bacon and eggs + cheese biscuit - Lunch: (12-13 units): sandwich or hamburger - Dinner: (11-12 units): grilled chicken and rice; spaghetti; sloppy joes - Snacks:PB crackers at 10 am; cheeze it's - boluses 3-4 units   - No CKD, last  BUN/creatinine:  Lab Results  Component Value Date   BUN 10 03/29/2016   CREATININE 1.25 03/29/2016   Lab Results  Component Value Date   GFR 54.44 (L) 03/25/2015   GFR 78.07 04/09/2014   No urinary protein at last check: Lab Results  Component Value Date   MICRALBCREAT 2.1 03/29/2016   MICRALBCREAT 0.7 03/25/2015   MICRALBCREAT 2.0 04/09/2014   - Latest lipid panel: Lab Results  Component Value Date   CHOL 135 03/29/2016   HDL 41.00 03/29/2016   LDLCALC 83 03/29/2016   TRIG 56.0 03/29/2016   CHOLHDL 3 03/29/2016   - Last TSH level: Lab Results  Component Value Date   TSH 1.23 03/29/2016   - last eye exam was in 05/2015 >> No DR.  - he denies numbness and tingling in his feet.  ROS: Constitutional: no weight gain/no weight loss, no fatigue, no subjective hyperthermia, no subjective hypothermia Eyes: no blurry vision, no xerophthalmia ENT: no sore throat, no nodules palpated in throat, no dysphagia, no odynophagia, no hoarseness Cardiovascular: no CP/no SOB/no palpitations/no leg swelling Respiratory: no cough/no SOB/no wheezing Gastrointestinal: no N/no V/no D/no C/no acid reflux Musculoskeletal: no muscle aches/no joint aches Skin: no rashes, no hair loss Neurological: no tremors/no numbness/no tingling/no dizziness  I reviewed pt's medications, allergies, PMH, social hx, family hx, and changes were documented in the history of present  illness. Otherwise, unchanged from my initial visit note.  Past Medical History:  Diagnosis Date  . Diabetes mellitus without complication ([place])    Type I    Past Surgical History:  Procedure Laterality Date  . NECK SURGERY  07/2011   History   Social History  . Marital Status:  married     Spouse Name: N/A    Number of Children: 1   Occupational History  .  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 Smoking status: Never Smoker   . Smokeless tobacco: Current User    Types: Chew  . Alcohol Use: 0.0 oz/week    0 Not specified per week  . Drug Use: No   Social History Narrative   Married   1 stepson   Allergies  Allergen Reactions  . Humalog [Insulin Lispro] Other (See Comments)    Ineffective   Family history: None  PE: BP 118/62 (BP Location: Left Arm, Patient Position: Sitting)   Pulse 72   Ht 5' 6.5" (1.689 m)   Wt 166 lb (75.3 kg)   SpO2 97%   BMI 26.39 kg/m  Body mass index is 26.39 kg/m. Wt Readings from Last 3 Encounters:  11/15/16 166 lb (75.3 kg)  07/28/16 154 lb (69.9 kg)  03/29/16 161 lb 9.6 oz (73.3 kg)   Constitutional: Normal weight, in NAD Eyes: PERRLA, EOMI, no exophthalmos ENT: moist mucous membranes, no thyromegaly, no cervical lymphadenopathy Cardiovascular: RRR, No MRG Respiratory: CTA B Gastrointestinal: abdomen soft, NT, ND, BS+ Musculoskeletal: no deformities, strength intact in all 4 Skin: moist, warm, no rashes Neurological: no tremor with outstretched hands, DTR normal in all 4  ASSESSMENT: 1. DM1, uncontrolled, without long term complications, but with hyperglycemia - Per  records from Dr. Gabriel Carina, patient's diabetes was never very uncontrolled, his range is 7.2-7.6%. I suspect that he still has some insulin production from his pancreas. Since he is not interested in the insulin pump right now, I did not check a C-peptide.  PLAN:  1. Patient with long standing, fairly well controlled DM1, with prev. Worse control at last visit after he changed his insulin to [employer]. We changed to [place], which worked well for him, but he changed insurance >> 400$ >> changed back to Lantus. He feels Lantus-Novolog regimen works best for him. No lows or extreme CBG peaks. - he still has few hyperglycemic spikes, but no lows - I advised him to:  Patient Instructions  Please continue - Lantus 35 units daily - Novolog as follows: - Breakfast:  ICR 1:13, target 110-120, ISF 30  - Lunch: ICR: 1:13, target 110-120, ISF 30 - Dinner: ICR 1:13; target 110-120, ISF 30   Please return in 3-4 months with your sugar log.   - today, HbA1c is 6.8% (better) - continue checking sugars at different times of the day - check 4x a day, rotating checks - advised for yearly eye exams >> he  Needs one  - Return to clinic in 3-4 mo with sugar log   Philemon Kingdom, MD PhD [facility] Endocrinology

## 2016-12-15 ENCOUNTER — Other Ambulatory Visit: Payer: Self-pay | Admitting: Internal Medicine

## 2017-01-11 ENCOUNTER — Emergency Department: Payer: PRIVATE HEALTH INSURANCE

## 2017-01-11 ENCOUNTER — Emergency Department
Admission: EM | Admit: 2017-01-11 | Discharge: 2017-01-11 | Disposition: A | Discharge status: Home / Self Care | Payer: PRIVATE HEALTH INSURANCE | Attending: Emergency Medicine | Admitting: Emergency Medicine

## 2017-01-11 ENCOUNTER — Encounter: Payer: Self-pay | Admitting: Emergency Medicine

## 2017-01-11 DIAGNOSIS — E109 Type 1 diabetes mellitus without complications: Secondary | ICD-10-CM | POA: Diagnosis not present

## 2017-01-11 DIAGNOSIS — Z794 Long term (current) use of insulin: Secondary | ICD-10-CM | POA: Insufficient documentation

## 2017-01-11 DIAGNOSIS — F17220 Nicotine dependence, chewing tobacco, uncomplicated: Secondary | ICD-10-CM | POA: Insufficient documentation

## 2017-01-11 DIAGNOSIS — R1032 Left lower quadrant pain: Secondary | ICD-10-CM | POA: Diagnosis present

## 2017-01-11 DIAGNOSIS — N200 Calculus of kidney: Secondary | ICD-10-CM

## 2017-01-11 LAB — CBC WITH DIFFERENTIAL/PLATELET
Basophils Absolute: 0 10*3/uL (ref 0–0.1)
Basophils Relative: 0 %
EOS ABS: 0.2 10*3/uL (ref 0–0.7)
EOS PCT: 2 %
HCT: 44.1 % (ref 40.0–52.0)
Hemoglobin: 15.4 g/dL (ref 13.0–18.0)
LYMPHS ABS: 2 10*3/uL (ref 1.0–3.6)
LYMPHS PCT: 22 %
MCH: 29.3 pg (ref 26.0–34.0)
MCHC: 34.9 g/dL (ref 32.0–36.0)
MCV: 84.2 fL (ref 80.0–100.0)
MONO ABS: 0.7 10*3/uL (ref 0.2–1.0)
Monocytes Relative: 8 %
Neutro Abs: 6.3 10*3/uL (ref 1.4–6.5)
Neutrophils Relative %: 68 %
PLATELETS: 322 10*3/uL (ref 150–440)
RBC: 5.24 MIL/uL (ref 4.40–5.90)
RDW: 13.3 % (ref 11.5–14.5)
WBC: 9.2 10*3/uL (ref 3.8–10.6)

## 2017-01-11 LAB — COMPREHENSIVE METABOLIC PANEL
ALT: 14 U/L — ABNORMAL LOW (ref 17–63)
ANION GAP: 8 (ref 5–15)
AST: 23 U/L (ref 15–41)
Albumin: 4.4 g/dL (ref 3.5–5.0)
Alkaline Phosphatase: 69 U/L (ref 38–126)
BUN: 10 mg/dL (ref 6–20)
CHLORIDE: 105 mmol/L (ref 101–111)
CO2: 25 mmol/L (ref 22–32)
CREATININE: 1.39 mg/dL — AB (ref 0.61–1.24)
Calcium: 9.1 mg/dL (ref 8.9–10.3)
Glucose, Bld: 84 mg/dL (ref 65–99)
POTASSIUM: 3.6 mmol/L (ref 3.5–5.1)
SODIUM: 138 mmol/L (ref 135–145)
Total Bilirubin: 0.8 mg/dL (ref 0.3–1.2)
Total Protein: 7.5 g/dL (ref 6.5–8.1)

## 2017-01-11 LAB — URINALYSIS, COMPLETE (UACMP) WITH MICROSCOPIC
Bilirubin Urine: NEGATIVE
GLUCOSE, UA: NEGATIVE mg/dL
Ketones, ur: NEGATIVE mg/dL
LEUKOCYTES UA: NEGATIVE
NITRITE: NEGATIVE
PH: 6 (ref 5.0–8.0)
Protein, ur: NEGATIVE mg/dL
Specific Gravity, Urine: 1.003 — ABNORMAL LOW (ref 1.005–1.030)

## 2017-01-11 LAB — GLUCOSE, CAPILLARY: Glucose-Capillary: 80 mg/dL (ref 65–99)

## 2017-01-11 MED ORDER — SODIUM CHLORIDE 0.9 % IV BOLUS (SEPSIS)
1000.0000 mL | Freq: Once | INTRAVENOUS | Status: AC
  Administered 2017-01-11: 1000 mL via INTRAVENOUS

## 2017-01-11 MED ORDER — TAMSULOSIN HCL 0.4 MG PO CAPS: 0.4000 mg | ORAL_CAPSULE | Freq: Every day | ORAL | 0 refills | Status: DC

## 2017-01-11 MED ORDER — OXYCODONE-ACETAMINOPHEN 5-325 MG PO TABS: 1.0000 | ORAL_TABLET | Freq: Four times a day (QID) | ORAL | 0 refills | Status: DC | PRN

## 2017-01-11 MED ORDER — ONDANSETRON 4 MG PO TBDP: 4.0000 mg | ORAL_TABLET | Freq: Three times a day (TID) | ORAL | 0 refills | Status: DC | PRN

## 2017-01-11 MED ORDER — KETOROLAC TROMETHAMINE 30 MG/ML IJ SOLN
30.0000 mg | Freq: Once | INTRAMUSCULAR | Status: AC
  Administered 2017-01-11: 30 mg via INTRAVENOUS
  Filled 2017-01-11: qty 1

## 2017-01-11 NOTE — ED Notes (Signed)
Pt went to CT

## 2017-01-11 NOTE — ED Notes (Signed)

## 2017-01-11 NOTE — Discharge Instructions (Signed)
PLease follow up  with urology should your pain persist. Please return with any worsened condition.

## 2017-01-11 NOTE — ED Provider Notes (Signed)
[facility] Emergency Department Provider Note   ____________________________________________   First MD Initiated Contact with Patient 01/11/17 [phone]     (approximate)  I have reviewed the triage vital signs and the nursing notes.   HISTORY  Chief Complaint Flank Pain    HPI Brian Braun is a 30 y.o. male who comes into the hospital today with a concern that he has a kidney stone. The patient has some left lower back pain that radiates to the front of his abdomen. The patient states he also feels as though he has to urinate. He has not noticed any blood in his urine. He went to urgent care over the weekend and was told that he didn't have any infection and urine. They informed him that they felt he might have a kidney stone and he was told to come back and get checked out again on Thursday. The patient was laying down this evening and states that he could not bear the pain. He reports that he's had some nausea with no vomiting. He's never had this pain before. He denies any fever or pain with urination. The patient states pain is a 4 out of 10 in intensity currently. He is here today for evaluation of his symptoms.   Past Medical History:  Diagnosis Date  . Diabetes mellitus without complication ([facility])    Type I     Patient Active Problem List   Diagnosis Date Noted  . Uncontrolled type 1 diabetes mellitus without complication ([facility]) 06/23/2015    Past Surgical History:  Procedure Laterality Date  . NECK SURGERY  07/2011    Prior to Admission medications   Medication Sig Start Date End Date Taking? Authorizing Provider  ACCU-CHEK SOFTCLIX LANCETS lancets USE TO TEST BLOOD SUGAR 4 TIMES A DAY 11/15/16   Carlus Pavlov, MD  glucagon (GLUCAGON EMERGENCY) 1 MG injection Inject 1 mg into the muscle once as needed. 04/02/14   Carlus Pavlov, MD  glucose blood test strip Use to test blood 4 times daily as instructed. For One Touch Verio meter.  03/29/16   Carlus Pavlov, MD  insulin aspart (NOVOLOG FLEXPEN) 100 UNIT/ML FlexPen INJECT 6-14 UNITS INTO THE SKIN 3 (THREE) TIMES DAILY WITH MEALS. 08/11/16   Carlus Pavlov, MD  Insulin Pen Needle (CLICKFINE PEN NEEDLES) 32G X 4 MM MISC Use 4x a day 07/28/16   Carlus Pavlov, MD  LANTUS SOLOSTAR 100 UNIT/ML Solostar Pen INJECT 35 UNITS INTO THE SKIN DAILY AT 10 PM 12/15/16   Carlus Pavlov, MD  ondansetron (ZOFRAN ODT) 4 MG disintegrating tablet Take 1 tablet (4 mg total) by mouth every 8 (eight) hours as needed for nausea or vomiting. 01/11/17   Rebecka Apley, MD  oxyCODONE-acetaminophen (ROXICET) 5-325 MG tablet Take 1 tablet by mouth every 6 (six) hours as needed. 01/11/17   Rebecka Apley, MD  tamsulosin (FLOMAX) 0.4 MG CAPS capsule Take 1 capsule (0.4 mg total) by mouth daily. 01/11/17   Rebecka Apley, MD    Allergies Basaglar Stephanie Coup [insulin glargine] and Humalog [insulin lispro]  No family history on file.  Social History Social History  Substance Use Topics  . Smoking status: Never Smoker  . Smokeless tobacco: Current User    Types: Chew  . Alcohol use 0.0 oz/week    Review of Systems  Constitutional: No fever/chills Eyes: No visual changes. ENT: No sore throat. Cardiovascular: Denies chest pain. Respiratory: Denies shortness of breath. Gastrointestinal:  abdominal pain,  nausea, no vomiting.  No diarrhea.  No constipation. Genitourinary: Negative for dysuria. Musculoskeletal: left back pain. Skin: Negative for rash. Neurological: Negative for headaches, focal weakness or numbness.   ____________________________________________   PHYSICAL EXAM:  VITAL SIGNS: ED Triage Vitals  Enc Vitals Group     BP 01/11/17 0216 (!) 149/100     Pulse Rate 01/11/17 0216 75     Resp 01/11/17 0216 18     Temp 01/11/17 0216 97.6 F (36.4 C)     Temp Source 01/11/17 0216 Oral     SpO2 01/11/17 0216 100 %     Weight 01/11/17 0215 155 lb (70.3 kg)      Height 01/11/17 0215  (1.651 m)     Head Circumference --      Peak Flow --      Pain Score 01/11/17 0213 9     Pain Loc --      Pain Edu? --      Excl. in GC? --     Constitutional: Alert and oriented. Well appearing and in mild distress. Eyes: Conjunctivae are normal. PERRL. EOMI. Head: Atraumatic. Nose: No congestion/rhinnorhea. Mouth/Throat: Mucous membranes are moist.  Oropharynx non-erythematous. Cardiovascular: Normal rate, regular rhythm. Grossly normal heart sounds.  Good peripheral circulation. Respiratory: Normal respiratory effort.  No retractions. Lungs CTAB. Gastrointestinal: Soft left lower abdomen tenderness to palpation. No distention. positive bowel sounds with left CVA tenderness to palpation Musculoskeletal: No lower extremity tenderness nor edema.   Neurologic:  Normal speech and language.  Skin:  Skin is warm, dry and intact.  Psychiatric: Mood and affect are normal.   ____________________________________________   LABS (all labs ordered are listed, but only abnormal results are displayed)  Labs Reviewed  COMPREHENSIVE METABOLIC PANEL - Abnormal; Notable for the following:       Result Value   Creatinine, Ser 1.39 (*)    ALT 14 (*)    All other components within normal limits  URINALYSIS, COMPLETE (UACMP) WITH MICROSCOPIC - Abnormal; Notable for the following:    Color, Urine STRAW (*)    APPearance CLEAR (*)    Specific Gravity, Urine 1.003 (*)    Hgb urine dipstick MODERATE (*)    Bacteria, UA RARE (*)    Squamous Epithelial / LPF 0-5 (*)    All other components within normal limits  GLUCOSE, CAPILLARY  CBC WITH DIFFERENTIAL/PLATELET   ____________________________________________  EKG  none ____________________________________________  RADIOLOGY  Ct Renal Stone Study  Result Date: 01/11/2017 CLINICAL DATA:  Sudden onset of left flank pain. EXAM: CT ABDOMEN AND PELVIS WITHOUT CONTRAST TECHNIQUE: Multidetector CT imaging of the abdomen  and pelvis was performed following the standard protocol without IV contrast. COMPARISON:  None. FINDINGS: Lower chest: The lung bases are clear. Hepatobiliary: No focal hepatic lesion allowing for lack contrast. Gallbladder partially distended, there is a Phrygian cap. No biliary dilatation. Pancreas: No ductal dilatation or inflammation. Spleen: Normal in size without focal abnormality. Adrenals/Urinary Tract: No adrenal nodule. Obstructing 4 mm stone at the left ureterovesicular junction with mild hydroureteronephrosis and minimal perinephric edema. There are at least 5 additional nonobstructing stones in the left kidney. No right hydronephrosis or hydroureter. Two nonobstructing stones in the right kidney. Urinary bladder is minimally distended, no bladder stone. Stomach/Bowel: Stomach is within normal limits. Appendix not confidently visualized. No evidence of bowel wall thickening, distention, or inflammatory changes. Vascular/Lymphatic: Normal caliber abdominal aorta. No abdominopelvic adenopathy. Reproductive: Prostate is unremarkable. Other: No free air, free fluid, or intra-abdominal fluid collection. Musculoskeletal:  There are no acute or suspicious osseous abnormalities. IMPRESSION: 1. Obstructing 4 mm stone at the left ureterovesicular junction with mild hydronephrosis. 2. Additional nonobstructing stones in both kidneys. Electronically Signed   By: Rubye Oaks M.D.   On: 01/11/2017 03:18    ____________________________________________   PROCEDURES  Procedure(s) performed: None  Procedures  Critical Care performed: No  ____________________________________________   INITIAL IMPRESSION / ASSESSMENT AND PLAN / ED COURSE  Pertinent labs & imaging results that were available during my care of the patient were reviewed by me and considered in my medical decision making (see chart for details).  this is a 31 year old male who comes into the hospital today with some left flank pain.  He reports that he's never had this pain before but he was unable to sleep with the pain this evening. My concern is that the patient may have a urinary tract infection or a kidney stone. I did check some blood work and sent the patient for a CT scan to evaluate.  the patient's blood work is unremarkable. His creatinine had some mild elevation at 1.39. His urine though showed 6-30 red blood cells and 0-5 white blood cells. the patient's CT scan returned with an obstructing 4 mm stone at the left UVJ with some mild Hydro. The patient did receive a liter of normal saline as well as a dose of Toradol. After that medication his pain was significantly improved. I inform the patient that I will discharge him to home to have him follow-up with urology should the pain persist. Chances are that the patient will pass this stone but should he have any worsening symptoms or any vomiting he should return to the emergency department for further evaluation.      ____________________________________________   FINAL CLINICAL IMPRESSION(S) / ED DIAGNOSES  Final diagnoses:  Kidney stone      NEW MEDICATIONS STARTED DURING THIS VISIT:  Discharge Medication List as of 01/11/2017  4:59 AM    START taking these medications   Details  ondansetron (ZOFRAN ODT) 4 MG disintegrating tablet Take 1 tablet (4 mg total) by mouth every 8 (eight) hours as needed for nausea or vomiting., Starting Tue 01/11/2017, Print    oxyCODONE-acetaminophen (ROXICET) 5-325 MG tablet Take 1 tablet by mouth every 6 (six) hours as needed., Starting Tue 01/11/2017, Print    tamsulosin (FLOMAX) 0.4 MG CAPS capsule Take 1 capsule (0.4 mg total) by mouth daily., Starting Tue 01/11/2017, Print         Note:  This document was prepared using Dragon voice recognition software and may include unintentional dictation errors.    Rebecka Apley, MD 01/11/17 [phone]

## 2017-01-11 NOTE — ED Triage Notes (Signed)
Patient ambulatory to triage with steady gait, without difficulty, appears uncomfortable; st onset left flank pain radiating into abd since Saturday; st pain has increased since with diff urinating

## 2017-01-11 NOTE — ED Notes (Signed)
Pt returned from CT °

## 2017-01-12 ENCOUNTER — Other Ambulatory Visit: Payer: Self-pay | Admitting: Internal Medicine

## 2017-03-18 ENCOUNTER — Encounter: Payer: Self-pay | Admitting: Internal Medicine

## 2017-03-18 ENCOUNTER — Ambulatory Visit (INDEPENDENT_AMBULATORY_CARE_PROVIDER_SITE_OTHER): Payer: PRIVATE HEALTH INSURANCE | Admitting: Internal Medicine

## 2017-03-18 VITALS — BP 122/88 | HR 92 | Wt 163.2 lb

## 2017-03-18 DIAGNOSIS — Z23 Encounter for immunization: Secondary | ICD-10-CM | POA: Diagnosis not present

## 2017-03-18 DIAGNOSIS — IMO0001 Reserved for inherently not codable concepts without codable children: Secondary | ICD-10-CM

## 2017-03-18 DIAGNOSIS — N289 Disorder of kidney and ureter, unspecified: Secondary | ICD-10-CM

## 2017-03-18 DIAGNOSIS — E1065 Type 1 diabetes mellitus with hyperglycemia: Secondary | ICD-10-CM

## 2017-03-18 LAB — COMPLETE METABOLIC PANEL WITH GFR
AG RATIO: 1.5 (calc) (ref 1.0–2.5)
ALBUMIN MSPROF: 4.3 g/dL (ref 3.6–5.1)
ALT: 12 U/L (ref 9–46)
AST: 17 U/L (ref 10–40)
Alkaline phosphatase (APISO): 74 U/L (ref 40–115)
BUN: 9 mg/dL (ref 7–25)
CALCIUM: 9.9 mg/dL (ref 8.6–10.3)
CO2: 27 mmol/L (ref 20–32)
Chloride: 102 mmol/L (ref 98–110)
Creat: 1.35 mg/dL (ref 0.60–1.35)
GFR, EST NON AFRICAN AMERICAN: 69 mL/min/{1.73_m2} (ref >=60)
GFR, Est African American: 80 mL/min/{1.73_m2} (ref >=60)
GLOBULIN: 2.8 g/dL (ref 1.9–3.7)
Glucose, Bld: 166 mg/dL — ABNORMAL HIGH (ref 65–99)
POTASSIUM: 4.2 mmol/L (ref 3.5–5.3)
SODIUM: 138 mmol/L (ref 135–146)
Total Bilirubin: 0.8 mg/dL (ref 0.2–1.2)
Total Protein: 7.1 g/dL (ref 6.1–8.1)

## 2017-03-18 LAB — MICROALBUMIN / CREATININE URINE RATIO
CREATININE, U: 352.2 mg/dL
MICROALB UR: 4.5 mg/dL — AB (ref 0.0–1.9)
Microalb Creat Ratio: 1.3 mg/g (ref 0.0–30.0)

## 2017-03-18 LAB — LIPID PANEL
CHOL/HDL RATIO: 4
Cholesterol: 131 mg/dL (ref 0–200)
HDL: 32.9 mg/dL — ABNORMAL LOW (ref >39.00)
LDL CALC: 86 mg/dL (ref 0–99)
NONHDL: 97.97
TRIGLYCERIDES: 62 mg/dL (ref 0.0–149.0)
VLDL: 12.4 mg/dL (ref 0.0–40.0)

## 2017-03-18 LAB — POCT GLYCOSYLATED HEMOGLOBIN (HGB A1C): HEMOGLOBIN A1C: 7.4

## 2017-03-18 LAB — TSH: TSH: 1.06 u[IU]/mL (ref 0.35–4.50)

## 2017-03-18 MED ORDER — GLUCAGON (RDNA) 1 MG IJ KIT: 1.0000 mg | PACK | Freq: Once | INTRAMUSCULAR | 99 refills | Status: DC | PRN

## 2017-03-18 NOTE — Progress Notes (Signed)
Patient ID: Brian Braun, male   DOB: 1985/07/02, 31 y.o.   MRN: [id number]   HPI: Brian Braun is a 31 y.o.-year-old male, returning for f/u for DM1, dx 1996, uncontrolled, without long-term complications. Last visit 4 mo ago.  He had a kidney stone in 12/2016 >> in ED >> passed it.  His GFR was lower then.  His sugars are usually higher during winter as he is less active at work (not mowing) and as they improved during the summer as he is more active.  Last hemoglobin A1c was: Lab Results  Component Value Date   HGBA1C 6.8 11/15/2016   HGBA1C 8.7 07/28/2016   HGBA1C 7.2 12/29/2015  03/25/2016: HbA1c 6.2% 10/11/2013: 7.6% 06/13/2013: 7.3%  Pt was on an insulin pump: Medtronic Minimed up to 2011 >> developed DKA >> did not restart after this, he is not interested to restart  He is now on: - Lantus 35 units in am - NovoLog as follows: - Breakfast: ICR 1:13, target 110-120, ISF 30  - Lunch: ICR: 1:13, target 110-120, ISF 30 - Dinner: ICR 1:13; target 110-120, ISF 30  Ends up 36 units (10-12 per meal)  He has an Therapist, occupational.   Pt checks his sugars 4 times a day: - am: 130-140 >> 180-280 >> 120-130 >> 130-150 - 2h after b'fast: n/c - before lunch: 150-160 (on purpose while at work) >> 140-150 - 2h after lunch: n/c - before dinner:115-120 >> 160-200 >> 130-140 >> 100-160 - 2h after dinner: n/c - bedtime:  <200 >> 105-120 (after mowing) >> 150-180, 200 - nighttime: before eating - 140-150s >> n/c Lowest sugar was 100 >> 65-70 x >> 80; he has hypoglycemia awareness at 60s.  No previous hypoglycemia admission.  He does have a glucagon kit at home. Highest sugar was 300 >> 200s >> 200. He had a previous DKA admission - 1x: 2011.    Pt's meals are: - Breakfast: (Usually takes 11 units of NovoLog): oatmeal or bacon and eggs + cheese biscuit - Lunch: (12-13 units): sandwich or hamburger - Dinner: (11-12 units): grilled chicken and rice; spaghetti; sloppy joes - Snacks:PB crackers  at 10 am; cheeze it's - boluses 3-4 units   -No history of CKD, last BUN/creatinine, however, latest GFR levels have been lower: Lab Results  Component Value Date   BUN 10 01/11/2017   CREATININE 1.39 (H) 01/11/2017   Lab Results  Component Value Date   GFR 54.44 (L) 03/25/2015   GFR 78.07 04/09/2014   No urinary protein at last check: Lab Results  Component Value Date   MICRALBCREAT 2.1 03/29/2016   MICRALBCREAT 0.7 03/25/2015   MICRALBCREAT 2.0 04/09/2014   -No HL; latest lipid panel: Lab Results  Component Value Date   CHOL 135 03/29/2016   HDL 41.00 03/29/2016   LDLCALC 83 03/29/2016   TRIG 56.0 03/29/2016   CHOLHDL 3 03/29/2016   - Last TSH level normal: Lab Results  Component Value Date   TSH 1.23 03/29/2016   - last eye exam was in 05/2015: No DR  -No numbness and tingling in his feet.  ROS: Constitutional: + weight gain/no weight loss, no fatigue, no subjective hyperthermia, no subjective hypothermia Eyes: no blurry vision, no xerophthalmia ENT: no sore throat, no nodules palpated in throat, no dysphagia, no odynophagia, no hoarseness Cardiovascular: no CP/no SOB/no palpitations/no leg swelling Respiratory: no cough/no SOB/no wheezing, + congestion (URI) Gastrointestinal: no N/no V/no D/no C/no acid reflux Musculoskeletal: no muscle aches/no joint aches Skin: no  rashes, no hair loss Neurological: no tremors/no numbness/no tingling/no dizziness  I reviewed pt's medications, allergies, PMH, social hx, family hx, and changes were documented in the history of present illness. Otherwise, unchanged from my initial visit note.  Past Medical History:  Diagnosis Date  . Diabetes mellitus without complication ([place])    Type I    Past Surgical History:  Procedure Laterality Date  . NECK SURGERY  07/2011   History   Social History  . Marital Status:  married     Spouse Name: N/A    Number of Children: 1   Occupational History  .  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 Smoking status: Never Smoker   . Smokeless tobacco: Current User    Types: Chew  . Alcohol Use: 0.0 oz/week    0 Not specified per week  . Drug Use: No   Social History Narrative   Married   1 stepson   Allergies  Allergen Reactions  . Basaglar Kwikpen [Insulin Glargine] Other (See Comments)    Ineffective  . Humalog [Insulin Lispro] Other (See Comments)    Ineffective   Family history: None  PE: BP 122/88   Pulse 92   Wt 163 lb 3.2 oz (74 kg)   SpO2 97%   BMI 27.16 kg/m  Body mass index is 27.16 kg/m. Wt Readings from Last 3 Encounters:  03/18/17 163 lb 3.2 oz (74 kg)  01/11/17 155 lb (70.3 kg)  11/15/16 166 lb (75.3 kg)   Constitutional: Normal weight, in NAD Eyes: PERRLA, EOMI, no exophthalmos ENT: moist mucous membranes, no thyromegaly, no cervical lymphadenopathy Cardiovascular: Tachycardia, RR, No MRG Respiratory: CTA B Gastrointestinal: abdomen soft, NT, ND, BS+ Musculoskeletal: no deformities, strength intact in all 4 Skin: moist, warm, no rashes Neurological: no tremor with outstretched hands, DTR normal in all 4  ASSESSMENT: 1. DM1, uncontrolled, without long term complications, but with hyperglycemia - Per  records from Dr. Gabriel Carina, patient's diabetes was never very uncontrolled, his range is 7.2-7.6%. I suspect that he still has some insulin production from his pancreas. Since he is not interested in the insulin pump right now, I did not check a C-peptide.  2.  Low GFR  PLAN:  1. Patient with long-standing, fairly well-controlled diabetes type 1, with previously worse control when he had to switch insulins to Basaglar and Humalog.  As of now, he does well on the Lantus/NovoLog regimen, however, we will likely need to change these at the beginning of the year because of insurance coverage.  We discussed  that we can use FiAsp and Apidra instead of Humalog if needed.  He will check with his insurance whether this would be cover -  for now, his sugars are higher during the winter (at all times of the day) as he is less active, so we will go ahead and increase his Lantus by 10% and also lower slightly his insulin to carb ratios - I advised him to:  Patient Instructions  Please increase: - Lantus 38 units in am - NovoLog as follows: - Breakfast: ICR 1:12, target 110-120, ISF 30  - Lunch: ICR: 1:12, target 110-120, ISF 30 - Dinner: ICR 1:12; target 110-120, ISF 30   Please check with your insurance whether Apidra or FiAsp are covered.  Please stop at the lab.  Please return in 4 months  with your sugar log.   - today, HbA1c is 7.4% (higher) - continue checking sugars at different times of the day - check 4x a day, rotating checks - advised for yearly eye exams >> he is not UTD  - We will give him the flu shot today - today we will check: Orders Placed This Encounter  Procedures  . Flu Vaccine QUAD 36+ mos IM  . Microalbumin / creatinine urine ratio  . COMPLETE METABOLIC PANEL WITH GFR  . Lipid panel  . TSH  - Return to clinic in 4 mo with sugar log   2.  Low GFR/abnormal kidney function - He had a high creatinine and low GFR at last check during an episode of kidney stone. - We will repeat the labs today now that he passed a kidney stone  Office Visit on 03/18/2017  Component Date Value Ref Range Status  . Microalb, Ur 03/18/2017 4.5* 0.0 - 1.9 mg/dL Final  . Creatinine,U 03/18/2017 352.2  mg/dL Final  . Microalb Creat Ratio 03/18/2017 1.3  0.0 - 30.0 mg/g Final  . Glucose, Bld 03/18/2017 166* 65 - 99 mg/dL Final   Comment: .            Fasting reference interval . For someone without known diabetes, a glucose value >125 mg/dL indicates that they may have diabetes and this should be confirmed with a follow-up test. .   . BUN 03/18/2017 9  7 - 25 mg/dL Final  . Creat  03/18/2017 1.35  0.60 - 1.35 mg/dL Final  . GFR, Est Non African American 03/18/2017 69  > OR = 60 mL/min/1.83m Final  . GFR, Est African American 03/18/2017 80  > OR = 60 mL/min/1.730mFinal  . BUN/Creatinine Ratio 1117/79/3903OT APPLICABLE  6 - 22 (calc) Final  . Sodium 03/18/2017 138  135 - 146 mmol/L Final  . Potassium 03/18/2017 4.2  3.5 - 5.3 mmol/L Final  . Chloride 03/18/2017 102  98 - 110 mmol/L Final  . CO2 03/18/2017 27  20 - 32 mmol/L Final  . Calcium 03/18/2017 9.9  8.6 - 10.3 mg/dL Final  . Total Protein 03/18/2017 7.1  6.1 - 8.1 g/dL Final  . Albumin 03/18/2017 4.3  3.6 - 5.1 g/dL Final  . Globulin 03/18/2017 2.8  1.9 - 3.7 g/dL (calc) Final  . AG Ratio 03/18/2017 1.5  1.0 - 2.5 (calc) Final  . Total Bilirubin 03/18/2017 0.8  0.2 - 1.2 mg/dL Final  . Alkaline phosphatase (APISO) 03/18/2017 74  40 - 115 U/L Final  . AST 03/18/2017 17  10 - 40 U/L Final  . ALT 03/18/2017 12  9 - 46 U/L Final  . Cholesterol 03/18/2017 131  0 - 200 mg/dL Final   ATP III Classification       Desirable:  < 200 mg/dL               Borderline High:  200 - 239 mg/dL          High:  > = 240 mg/dL  . Triglycerides 03/18/2017 62.0  0.0 - 149.0 mg/dL Final   Normal:  <150 mg/dLBorderline High:  150 - 199 mg/dL  . HDL 03/18/2017 32.90* >39.00 mg/dL Final  . VLDL 03/18/2017 12.4  0.0 - 40.0 mg/dL Final  . LDL Cholesterol 03/18/2017 86  0 - 99 mg/dL Final  . Total CHOL/HDL Ratio 03/18/2017 4   Final  Men          Women1/2 Average Risk     3.4          3.3Average Risk          5.0          4.42X Average Risk          9.6          7.13X Average Risk          15.0          11.0                      . NonHDL 03/18/2017 97.97   Final   NOTE:  Non-HDL goal should be 30 mg/dL higher than patient's LDL goal (i.e. LDL goal of < 70 mg/dL, would have non-HDL goal of < 100 mg/dL)  . TSH 03/18/2017 1.06  0.35 - 4.50 uIU/mL Final  . Hemoglobin A1C 03/18/2017 7.4   Final   Labs OK, exc. High glu  (nonfasting) at 166. Kidney fxn slightly better.  Philemon Kingdom, MD PhD [facility] Endocrinology

## 2017-03-18 NOTE — Patient Instructions (Addendum)
Please increase: - Lantus 38 units in am - NovoLog as follows: - Breakfast: ICR 1:12, target 110-120, ISF 30  - Lunch: ICR: 1:12, target 110-120, ISF 30 - Dinner: ICR 1:12; target 110-120, ISF 30   Please check with your insurance whether Apidra or FiAsp are covered.  Please stop at the lab.  Please return in 4 months with your sugar log.

## 2017-03-21 ENCOUNTER — Telehealth: Payer: Self-pay

## 2017-03-21 NOTE — Telephone Encounter (Signed)
LVM that labs were normal

## 2017-03-21 NOTE — Telephone Encounter (Signed)
-----   Message from Carlus Pavlovristina Gherghe, MD sent at 03/21/2017  7:57 AM EST ----- Toni Amendourtney, can you please call pt: Labs are all OK. Glu was 166.

## 2017-05-29 ENCOUNTER — Other Ambulatory Visit: Payer: Self-pay | Admitting: Internal Medicine

## 2017-07-14 ENCOUNTER — Ambulatory Visit (INDEPENDENT_AMBULATORY_CARE_PROVIDER_SITE_OTHER): Payer: PRIVATE HEALTH INSURANCE | Admitting: Internal Medicine

## 2017-07-14 ENCOUNTER — Encounter: Payer: Self-pay | Admitting: Internal Medicine

## 2017-07-14 VITALS — BP 134/86 | HR 90 | Ht 65.0 in | Wt 163.0 lb

## 2017-07-14 DIAGNOSIS — E103299 Type 1 diabetes mellitus with mild nonproliferative diabetic retinopathy without macular edema, unspecified eye: Secondary | ICD-10-CM | POA: Diagnosis not present

## 2017-07-14 LAB — POCT GLYCOSYLATED HEMOGLOBIN (HGB A1C): Hemoglobin A1C: 7.1

## 2017-07-14 NOTE — Patient Instructions (Signed)
Please continue: - Lantus 38 units in am - NovoLog as follows:  ICR 1:12, target 110-120, ISF 30  Please return in 4 months with your sugar log.

## 2017-07-14 NOTE — Progress Notes (Signed)
Patient ID: PETERSON MATHEY, male   DOB: [birth year], 32 y.o.   MRN: [id number]   HPI: [name] is a 32 y.o.-year-old male, returning for f/u for DM1, dx 1996, uncontrolled, without long-term complications. Last visit 4 months ago.  His sugars are usually higher during the winter as he is less active at work (not mowing).  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  Last hemoglobin A1c was: Lab Results  Component Value Date   HGBA1C 7.4 03/18/2017   HGBA1C 6.8 11/15/2016   HGBA1C 8.7 07/28/2016  03/25/2016: HbA1c 6.2% 10/11/2013: 7.6% 06/13/2013: 7.3%  He was on the Medtronic MiniMed insulin pump up to 2011, but he developed a DKA episode and since then he is not interested in restarting the pump.  He is now on: - Lantus 38 units in am - NovoLog as follows:  ICR 1:12, target 110-120, ISF 30  He has an Therapist, occupational.   Pt checks his sugars 4 times a day: - am:  120-130 >> 130-150 >> 120-130 - 2h after b'fast: n/c - before lunch: 150-160 >> 140-150 >> 150-160 (he keeps this higher on purpose so that he does not drop his sugars at work) - 2h after lunch: n/c - before dinner: 130-140 >> 100-160 >> 65 (unclear why), 100-130 - 2h after dinner: n/c - bedtime:  105-120 (after mowing) >> 150-180, 200 >> 120-150 - nighttime: before eating - 140-150s >> n/c Lowest sugar was 80 >> 65; he has hypoglycemia awareness  in the 60s.  No previous hypoglycemia admission.  He does have a non-expired glucagon kit at home. Highest sugar was 200 >> 198 (Holidays). He had one DKA admission -1 times, in 2011  Pt's meals are: - Breakfast: (Usually takes 11 units of NovoLog): oatmeal or bacon and eggs + cheese biscuit - Lunch: (12-13 units): sandwich or hamburger - Dinner: (11-12 units): grilled chicken and rice; spaghetti; sloppy joes - Snacks:PB crackers at 10 am; cheeze it's - boluses 3-4 units   -No history of KD, last BUN/creatinine: Lab  Results  Component Value Date   BUN 9 03/18/2017   CREATININE 1.35 03/18/2017   Lab Results  Component Value Date   GFR 54.44 (L) 03/25/2015   GFR 78.07 04/09/2014   No MAU at last check: Lab Results  Component Value Date   MICRALBCREAT 1.3 03/18/2017   MICRALBCREAT 2.1 03/29/2016   MICRALBCREAT 0.7 03/25/2015   MICRALBCREAT 2.0 04/09/2014   - + dyslipidemia; latest lipid panel: Lab Results  Component Value Date   CHOL 131 03/18/2017   HDL 32.90 (L) 03/18/2017   LDLCALC 86 03/18/2017   TRIG 62.0 03/18/2017   CHOLHDL 4 03/18/2017   - Last TSH level reviewed: Normal: Lab Results  Component Value Date   TSH 1.06 03/18/2017   - last eye exam was in 05/2015: + DR -Denies numbness and tingling in his feet.  ROS: Constitutional: no weight gain/no weight loss, no fatigue, no subjective hyperthermia, no subjective hypothermia Eyes: no blurry vision, no xerophthalmia ENT: no sore throat, no nodules palpated in throat, no dysphagia, no odynophagia, no hoarseness Cardiovascular: no CP/no SOB/no palpitations/no leg swelling Respiratory: no cough/no SOB/no wheezing Gastrointestinal: no N/no V/no D/no C/no acid reflux Musculoskeletal: no muscle aches/no joint aches Skin: no rashes, no hair loss Neurological: no tremors/no numbness/no tingling/no dizziness  I reviewed pt's medications, allergies, PMH, social hx, family hx, and changes were documented in the history of present illness.  Otherwise, unchanged from my initial visit note.  Past Medical History:  Diagnosis Date  . Diabetes mellitus without complication ([place])    Type I    Past Surgical History:  Procedure Laterality Date  . NECK SURGERY  07/2011   History   Social History  . Marital Status:  married     Spouse Name: N/A    Number of Children: 1   Occupational History  .  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 Smoking status: Never Smoker   . Smokeless tobacco: Current User    Types: Chew  . Alcohol Use: 0.0 oz/week    0 Not specified per week  . Drug Use: No   Social History Narrative   Married   1 stepson   Current Outpatient Medications  Medication Sig Dispense Refill  . ACCU-CHEK SOFTCLIX LANCETS lancets USE TO TEST BLOOD SUGAR 4 TIMES A DAY 200 each 11  . glucagon (GLUCAGON EMERGENCY) 1 MG injection Inject 1 mg into the muscle once as needed. 2 each prn  . glucose blood test strip Use to test blood 4 times daily as instructed. For One Touch Verio meter. 400 each 11  . insulin aspart (NOVOLOG FLEXPEN) 100 UNIT/ML FlexPen INJECT 6-14 UNITS INTO THE SKIN 3 (THREE) TIMES DAILY WITH MEALS. 45 mL 3  . Insulin Pen Needle (CLICKFINE PEN NEEDLES) 32G X 4 MM MISC Use 4x a day 400 each 3  . LANTUS SOLOSTAR 100 UNIT/ML Solostar Pen INJECT 35 UNITS INTO THE SKIN DAILY AT 10 PM 45 pen 0  . oxyCODONE-acetaminophen (ROXICET) 5-325 MG tablet Take 1 tablet by mouth every 6 (six) hours as needed. (Patient not taking: Reported on 03/18/2017) 12 tablet 0   No current facility-administered medications for this visit.    Allergies  Allergen Reactions  . Basaglar Kwikpen [Insulin Glargine] Other (See Comments)    Ineffective  . Humalog [Insulin Lispro] Other (See Comments)    Ineffective   Family history: None  PE: BP 134/86   Pulse 90   Ht '5\' 5"'$  (1.651 m)   Wt 163 lb (73.9 kg)   SpO2 97%   BMI 27.12 kg/m  Body mass index is 27.12 kg/m. Wt Readings from Last 3 Encounters:  07/14/17 163 lb (73.9 kg)  03/18/17 163 lb 3.2 oz (74 kg)  01/11/17 155 lb (70.3 kg)   Constitutional: Normal weight, in NAD Eyes: PERRLA, EOMI, no exophthalmos ENT: moist mucous membranes, no thyromegaly, no cervical lymphadenopathy Cardiovascular: RRR, No MRG Respiratory: CTA B Gastrointestinal: abdomen soft, NT, ND, BS+ Musculoskeletal: no deformities, strength intact in all 4 Skin:  moist, warm, no rashes Neurological: no tremor with outstretched hands, DTR normal in all 4  ASSESSMENT: 1. DM1, uncontrolled, with complications (background DR)  - Per  records from Dr. Gabriel Carina, patient's diabetes was never very uncontrolled, his range is 7.2-7.6%. I suspect that he still has some insulin production from his pancreas.  - He is not interested in an insulin pump.  PLAN:  1. Patient with long-standing, fairly well-controlled diabetes type 1, with previously worse control when he had to switch insulins to Basaglar and Humalog. He is now doing well on Lantus/NovoLog. At last visit, we discussed that we could use FiAsp and Apidra.  At last visit, as his sugars were higher (this is usual for him during the winter as  he is less active) we increased his Lantus by 10% and also lowered slightly his insulin to carb ratios. - At this visit, sugars are better, as he is also more active at work in the afternoon.  However, before lunch, his sugars are higher, and he keeps them like that on purpose to avoid low blood sugars at work.  We discussed about increasing his a.m. NovoLog slightly to drop his prelunch sugars in the 120-130 range.  He will  try to do this. - if his insurance covers Humalog this year >> we will need a PA for Novolog/FiAsp or Apidra - I advised him to:  Patient Instructions  Please continue: - Lantus 38 units in am - NovoLog as follows:  ICR 1:12, target 110-120, ISF 30  Please return in 4 months with your sugar log.   - today, HbA1c is 7.1% (better) - continue checking sugars at different times of the day - check 4x a day, rotating checks - advised for yearly eye exams >> he is due, strongly advised him to schedule this - we gave him the flu shot at last visit - Return to clinic in 4 mo with sugar log   - time spent with the patient: 25 minutes, of which >50% was spent in obtaining information about his sugars at home, reviewing together his previous labs,  evaluations, and treatments, counseling him about his diabetes and developing a plan to further treat it.  Philemon Kingdom, MD PhD [facility] Endocrinology

## 2017-07-26 ENCOUNTER — Telehealth: Payer: Self-pay | Admitting: Internal Medicine

## 2017-07-26 NOTE — Telephone Encounter (Signed)
Please call patient asap at ph# [phone]-he needs to discuss his medication-will soon be out medication but needs to discuss insulin type

## 2017-07-27 MED ORDER — INSULIN ASPART 100 UNIT/ML FLEXPEN: PEN_INJECTOR | SUBCUTANEOUS | 3 refills | Status: DC

## 2017-07-27 NOTE — Telephone Encounter (Signed)
Pt states insurance will change soon and Novolog will no longer be covered. Advised pt to provide list of what will be covered. Sent refill as requested.

## 2017-07-28 ENCOUNTER — Telehealth: Payer: Self-pay | Admitting: Internal Medicine

## 2017-07-28 NOTE — Telephone Encounter (Signed)
Patient is calling in regards to insurance and what they will cover.  Patient would like a call back   Thanks

## 2017-07-29 NOTE — Telephone Encounter (Signed)
C, Let us start a PA for NovoLog, I think we had to do this in the past also. Do we have any NovoLog samples  in case he will be able to drop by later and pick them up? We could send Humalog, but if not, he will need to take regular insulin, at the same doses, but injecting 30 minutes before a meal.  He can get this without prescription from [facility], but it only comes in a vial.  Please let me know what he decides.

## 2017-07-29 NOTE — Telephone Encounter (Signed)
Ok. Spoke to patient. Gave instructions. Patient verbalized understanding. Pt would like PA for Novolog to be initiated. Request Rx for Humalog sent also just to see price. If med too expensive, will purchase regular insulin and give office call next week for update.

## 2017-07-29 NOTE — Telephone Encounter (Signed)
Spoke to patient. Novolog is $600 w/ insurance. Pt says he will run out this weekend. Unable to come to office for sample. Spoke w/ pharmacist. Pt has a high deductible plan. Pt requesting advice.

## 2017-08-01 ENCOUNTER — Telehealth: Payer: Self-pay | Admitting: Internal Medicine

## 2017-08-01 MED ORDER — INSULIN LISPRO 100 UNIT/ML (KWIKPEN): 6.0000 [IU] | PEN_INJECTOR | Freq: Three times a day (TID) | SUBCUTANEOUS | 0 refills | Status: DC

## 2017-08-01 NOTE — Telephone Encounter (Signed)
Spoke to patient.  Sent Humalog Rx as requested. Patient verbalized understanding.

## 2017-08-01 NOTE — Telephone Encounter (Signed)
Patient stated that the pharmacy did not receive his insulin Humalog, please advise [id number] - [place], [place] - [address] [phone] (Phone) [phone] (Fax)

## 2017-08-03 ENCOUNTER — Telehealth: Payer: Self-pay

## 2017-08-03 NOTE — Telephone Encounter (Signed)
Started PA for Novolog through [employer]. PA#: [id number]

## 2017-08-10 NOTE — Telephone Encounter (Signed)
Spoke to patient. Novolog PA approved but pt cannot afford. Using Humalog now ($25) vial. Waiting to see if it is more effective than what was in the past.

## 2017-08-29 ENCOUNTER — Other Ambulatory Visit: Payer: Self-pay | Admitting: Internal Medicine

## 2017-08-31 ENCOUNTER — Telehealth: Payer: Self-pay

## 2017-08-31 ENCOUNTER — Other Ambulatory Visit: Payer: Self-pay | Admitting: Internal Medicine

## 2017-08-31 ENCOUNTER — Telehealth: Payer: Self-pay | Admitting: Internal Medicine

## 2017-08-31 NOTE — Telephone Encounter (Signed)
Patient would like a call back to discuss his insulin.,  Please advise

## 2017-08-31 NOTE — Telephone Encounter (Signed)
I spoke with patient & he stated that he really wanted to be on Novolog. I called insurance & it is covered it's just $600. I asked pharmacy if he could possibly try to use a co-pay card. I am sending one to patient to see if it can be used. Otherwise patient stated that he would stay on the humalog.

## 2017-09-01 NOTE — Telephone Encounter (Signed)
Error

## 2017-10-28 ENCOUNTER — Other Ambulatory Visit: Payer: Self-pay | Admitting: Internal Medicine

## 2017-11-10 ENCOUNTER — Encounter: Payer: Self-pay | Admitting: Internal Medicine

## 2017-11-10 ENCOUNTER — Ambulatory Visit: Payer: PRIVATE HEALTH INSURANCE | Admitting: Internal Medicine

## 2017-11-10 VITALS — BP 120/82 | HR 77 | Ht 65.0 in | Wt 157.8 lb

## 2017-11-10 DIAGNOSIS — E103299 Type 1 diabetes mellitus with mild nonproliferative diabetic retinopathy without macular edema, unspecified eye: Secondary | ICD-10-CM

## 2017-11-10 DIAGNOSIS — E785 Hyperlipidemia, unspecified: Secondary | ICD-10-CM | POA: Insufficient documentation

## 2017-11-10 LAB — POCT GLYCOSYLATED HEMOGLOBIN (HGB A1C): Hemoglobin A1C: 7.6 % — AB (ref 4.0–5.6)

## 2017-11-10 MED ORDER — INSULIN LISPRO 100 UNIT/ML (KWIKPEN): PEN_INJECTOR | SUBCUTANEOUS | 0 refills | Status: DC

## 2017-11-10 MED ORDER — INSULIN ASPART 100 UNIT/ML FLEXPEN: PEN_INJECTOR | SUBCUTANEOUS | 3 refills | Status: DC

## 2017-11-10 NOTE — Patient Instructions (Signed)
Please continue: - Lantus 38 units in am - NovoLog as follows:  ICR 1:12, target 110-120, ISF 30  Please return in 4 months with your sugar log.

## 2017-11-10 NOTE — Progress Notes (Signed)
Patient ID: Brian Braun, male   DOB: 1985-05-18, 32 y.o.   MRN: [id number]   HPI: Brian Braun is a 32 y.o.-year-old male, returning for f/u for DM1, dx 1996, uncontrolled, without long-term complications. Last visit 4 months ago.  Since last visit, he unfortunately had to switch back to Humalog, which is not usually very efficient for him.  NovoLog was approved by his insurance but his co-pay is very high, at $600.  For Humalog he only has to pay $25.  Sugars are slightly higher after the change.  Last hemoglobin A1c was: Lab Results  Component Value Date   HGBA1C 7.1 07/14/2017   HGBA1C 7.4 03/18/2017   HGBA1C 6.8 11/15/2016  03/25/2016: HbA1c 6.2% 10/11/2013: 7.6% 06/13/2013: 7.3%  He was on the Medtronic MiniMed insulin pump up to 2011, but he developed a DKA episode then and he is now not interested in another insulin pump.  He is now on: - Lantus 38 units in am  - NovoLog >> Humalog as follows:  ICR 1:12, target 110-120, ISF 30  He has an Accu-Chek expert meter  Pt checks his sugars 4X a day: - am: 130-150 >> 120-130 >> 120-130 - 2h after b'fast: n/c - before lunch:  140-150 >> 150-160 >> 140-160 - 2h after lunch: n/c - before dinner: 100-160 >> 65 (unclear why), 100-130 >> 100-140 - 2h after dinner: n/c - bedtime:   150-180, 200 >> 120-150 >> up to 150 - nighttime: before eating - 140-150s >> n/c Lowest sugar was 80 >> 65 >> 80; he has hypoglycemia awareness in t he does have a non-expired glucagon kit at home. Highest sugar was 200 >> 198 (Holidays) >> 200 x3. He had one DKA admission in 2011.  Pt's meals are: - Breakfast: (Usually takes 11 units of NovoLog): oatmeal or bacon and eggs + cheese biscuit - Lunch: (12-13 units): sandwich or hamburger - Dinner: (11-12 units): grilled chicken and rice; spaghetti; sloppy joes - Snacks:PB crackers at 10 am; cheeze it's - boluses 3-4 units   -No CKD, last BUN/creatinine: Lab Results  Component Value Date   BUN 9 03/18/2017    CREATININE 1.35 03/18/2017   Lab Results  Component Value Date   GFR 54.44 (L) 03/25/2015   GFR 78.07 04/09/2014   No MAU: Lab Results  Component Value Date   MICRALBCREAT 1.3 03/18/2017   MICRALBCREAT 2.1 03/29/2016   MICRALBCREAT 0.7 03/25/2015   MICRALBCREAT 2.0 04/09/2014   -+ Dyslipidemia; latest lipid panel: Lab Results  Component Value Date   CHOL 131 03/18/2017   HDL 32.90 (L) 03/18/2017   LDLCALC 86 03/18/2017   TRIG 62.0 03/18/2017   CHOLHDL 4 03/18/2017   - Last TSH normal: Lab Results  Component Value Date   TSH 1.06 03/18/2017   - last eye exam was in 05/2015: + DR - no numbness and tingling in his feet.  ROS: Constitutional: no weight gain/no weight loss, no fatigue, no subjective hyperthermia, no subjective hypothermia Eyes: no blurry vision, no xerophthalmia ENT: no sore throat, no nodules palpated in throat, no dysphagia, no odynophagia, no hoarseness Cardiovascular: no CP/no SOB/no palpitations/no leg swelling Respiratory: + cough/no SOB/no wheezing Gastrointestinal: no N/no V/no D/no C/no acid reflux Musculoskeletal: no muscle aches/no joint aches Skin: no rashes, no hair loss Neurological: no tremors/no numbness/no tingling/no dizziness  I reviewed pt's medications, allergies, PMH, social hx, family hx, and changes were documented in the history of present illness. Otherwise, unchanged from my initial visit note.  Past  Medical History:  Diagnosis Date  . Diabetes mellitus without complication ([place])    Type I    Past Surgical History:  Procedure Laterality Date  . NECK SURGERY  07/2011   History   Social History  . Marital Status:  married     Spouse Name: N/A    Number of Children: 1   Occupational History  .  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 Smoking status: Never Smoker   . Smokeless tobacco:  Current User    Types: Chew  . Alcohol Use: 0.0 oz/week    0 Not specified per week  . Drug Use: No   Social History Narrative   Married   1 stepson   Current Outpatient Medications  Medication Sig Dispense Refill  . ACCU-CHEK SOFTCLIX LANCETS lancets USE TO TEST BLOOD SUGAR 4 TIMES A DAY 200 each 11  . glucagon (GLUCAGON EMERGENCY) 1 MG injection Inject 1 mg into the muscle once as needed. 2 each prn  . glucose blood test strip Use to test blood 4 times daily as instructed. For One Touch Verio meter. 400 each 11  . HUMALOG KWIKPEN 100 UNIT/ML KiwkPen INJECT 6-14 UNITS TOTAL INTO THE SKIN 3 (THREE) TIMES DAILY. 45 pen 0  . insulin aspart (NOVOLOG FLEXPEN) 100 UNIT/ML FlexPen INJECT 6-14 UNITS INTO THE SKIN 3 (THREE) TIMES DAILY WITH MEALS. 45 mL 3  . Insulin Pen Needle (CLICKFINE PEN NEEDLES) 32G X 4 MM MISC Use 4x a day 400 each 3  . LANTUS SOLOSTAR 100 UNIT/ML Solostar Pen INJECT 35 UNITS INTO THE SKIN DAILY AT 10 PM 30 pen 1  . oxyCODONE-acetaminophen (ROXICET) 5-325 MG tablet Take 1 tablet by mouth every 6 (six) hours as needed. 12 tablet 0   No current facility-administered medications for this visit.    Allergies  Allergen Reactions  . Basaglar Kwikpen [Insulin Glargine] Other (See Comments)    Ineffective  . Humalog [Insulin Lispro] Other (See Comments)    Ineffective   Family history: None  PE: BP 120/82   Pulse 77   Ht '5\' 5"'  (1.651 m)   Wt 157 lb 12.8 oz (71.6 kg)   SpO2 98%   BMI 26.26 kg/m  Body mass index is 26.26 kg/m. Wt Readings from Last 3 Encounters:  11/10/17 157 lb 12.8 oz (71.6 kg)  07/14/17 163 lb (73.9 kg)  03/18/17 163 lb 3.2 oz (74 kg)   Constitutional: Normal weight, in NAD Eyes: PERRLA, EOMI, no exophthalmos ENT: moist mucous membranes, no thyromegaly, no cervical lymphadenopathy Cardiovascular: RRR, No MRG Respiratory: CTA B Gastrointestinal: abdomen soft, NT, ND, BS+ Musculoskeletal: no deformities, strength intact in all 4 Skin: moist,  warm, no rashes Neurological: no tremor with outstretched hands, DTR normal in all 4  ASSESSMENT: 1. DM1, uncontrolled, with complications (background DR)  - Per  records from Dr. Gabriel Carina, patient's diabetes was never very uncontrolled, his range is 7.2-7.6%. I suspect that he still has some insulin production from his pancreas.  - He is not interested in an insulin pump.  2. Dyslipidemia  PLAN:  1. Patient with long-standing, fairly well-controlled type 1 diabetes, with previously worse control when he had to switch insulins to Basaglar and Humalog.  He was then doing much better on Lantus/NovoLog.  However, since last visit, his insurance wanted to switch him back to Humalog and  we started a preauthorization form for this.   - At last visit, sugars were better as he was more active, as usual during the summer.  However, before lunch, his sugars are higher but he keeps them like that on purpose to avoid low blood sugars while at work.  We discussed about increasing NovoLog slightly to drop his prelunch sugars in the 120-130 range. - At this visit, sugars are slightly higher after switching to Humalog.  Today I gave him a coupon card for NovoLog for $25 and I really hope that he can obtain this. - No changes are needed in his regimen for now especially if he is able to go back to NovoLog - I advised him to:  Patient Instructions  Please continue: - Lantus 38 units in am - NovoLog as follows:  ICR 1:12, target 110-120, ISF 30  Please return in 4 months with your sugar log.   - today, HbA1c is 7.6% (higher) - continue checking sugars at different times of the day - check 4x a day, rotating checks - advised for yearly eye exams >> he is not UTD -strongly advised him to schedule this - Return to clinic in 4 mo with sugar log    2. Dyslipidemia - He has a slightly low HDL on his 02/2017 lipid panel - no intervention needed for this right now - We will recheck lipids at next  visit  Philemon Kingdom, MD PhD [facility] Endocrin[facility]

## 2017-11-10 NOTE — Addendum Note (Signed)
Addended by: Yolande JollyLAWSON, Cande Mastropietro on: 11/10/2017 08:46 AM   Modules accepted: Orders

## 2018-03-13 ENCOUNTER — Ambulatory Visit: Payer: PRIVATE HEALTH INSURANCE | Admitting: Internal Medicine

## 2018-03-13 ENCOUNTER — Encounter: Payer: Self-pay | Admitting: Internal Medicine

## 2018-03-13 VITALS — BP 122/90 | HR 86 | Ht 65.0 in | Wt 162.0 lb

## 2018-03-13 DIAGNOSIS — E785 Hyperlipidemia, unspecified: Secondary | ICD-10-CM | POA: Diagnosis not present

## 2018-03-13 DIAGNOSIS — E103299 Type 1 diabetes mellitus with mild nonproliferative diabetic retinopathy without macular edema, unspecified eye: Secondary | ICD-10-CM | POA: Diagnosis not present

## 2018-03-13 LAB — LIPID PANEL
CHOL/HDL RATIO: 3
Cholesterol: 137 mg/dL (ref 0–200)
HDL: 44.3 mg/dL (ref >39.00)
LDL Cholesterol: 79 mg/dL (ref 0–99)
NONHDL: 92.87
Triglycerides: 67 mg/dL (ref 0.0–149.0)
VLDL: 13.4 mg/dL (ref 0.0–40.0)

## 2018-03-13 LAB — POCT GLYCOSYLATED HEMOGLOBIN (HGB A1C): Hemoglobin A1C: 7.5 % — AB (ref 4.0–5.6)

## 2018-03-13 LAB — TSH: TSH: 1.27 u[IU]/mL (ref 0.35–4.50)

## 2018-03-13 LAB — MICROALBUMIN / CREATININE URINE RATIO
CREATININE, U: 279.5 mg/dL
Microalb Creat Ratio: 1.2 mg/g (ref 0.0–30.0)
Microalb, Ur: 3.5 mg/dL — ABNORMAL HIGH (ref 0.0–1.9)

## 2018-03-13 MED ORDER — INSULIN DEGLUDEC 200 UNIT/ML ~~LOC~~ SOPN: 38.0000 [IU] | PEN_INJECTOR | Freq: Every day | SUBCUTANEOUS | 5 refills | Status: DC

## 2018-03-13 NOTE — Progress Notes (Signed)
Patient ID: Brian Braun, male   DOB: Dec 03, 1985, 32 y.o.   MRN: [id number]   HPI: Brian Braun is a 32 y.o.-year-old male, returning for f/u for DM1, dx 1996, uncontrolled, with long-term complications (DR). Last visit 4 months ago.  At last visit, he had to switch from NovoLog to Humalog, which is not usually very efficient for him.  NovoLog was approved by his insurance but his co-pay is very high, $600.  For Humalog, he only has to pay $25.  Sugars were slightly higher at last visit after this change in his HbA1c also returned higher.  Last hemoglobin A1c was: 01/2018: HbA1c (work): 6.8% (Novolog) Lab Results  Component Value Date   HGBA1C 7.6 (A) 11/10/2017   HGBA1C 7.1 07/14/2017   HGBA1C 7.4 03/18/2017  03/25/2016: HbA1c 6.2% 10/11/2013: 7.6% 06/13/2013: 7.3%  He was previously on the Medtronic MiniMed insulin pump up to 2011, but he developed a DKA episode and he is now not interested in restarting using a pump.  He is now on: - Lantus 38 units in a.m. - NovoLog >> Humalog as follows:  ICR 1:12, target 110-120, ISF 30  He has an Accu-Chek expert meter  Pt checks his sugars 4X a day - no log: - am: 130-150 >> 120-130 >> 120-130 >> 130-150 - 2h after b'fast: n/c - before lunch: 150-160 >> 140-160 >> 80, 150-180 - 2h after lunch: n/c - before dinner: 65, 100-130 >> 100-140 >> 150-180 - 2h after dinner: n/c - bedtime:   150-180, 200 >> 120-150 >> up to 150 >> 140-150 - nighttime: before eating - 140-150s >> n/c Lowest sugar was 80 >> 65 >> 80 >> 80; he has hypoglycemia awareness in the 60s.  He has a non-expired glucagon kit at home. Highest sugar was 200 x3 >> 250 He had one DKA admission in 2011.  Pt's meals are: - Breakfast: (Usually takes 11 units of NovoLog): oatmeal or bacon and eggs + cheese biscuit - Lunch: (12-13 units): sandwich or hamburger - Dinner: (11-12 units): grilled chicken and rice; spaghetti; sloppy joes - Snacks:PB crackers at 10 am; cheeze it's -  boluses 3-4 units   -No CKD, last BUN/creatinine: Lab Results  Component Value Date   BUN 9 03/18/2017   CREATININE 1.35 03/18/2017   Lab Results  Component Value Date   GFR 54.44 (L) 03/25/2015   GFR 78.07 04/09/2014   No MAU: Lab Results  Component Value Date   MICRALBCREAT 1.3 03/18/2017   MICRALBCREAT 2.1 03/29/2016   MICRALBCREAT 0.7 03/25/2015   MICRALBCREAT 2.0 04/09/2014   -+ Dyslipidemia; latest lipid panel: Lab Results  Component Value Date   CHOL 131 03/18/2017   HDL 32.90 (L) 03/18/2017   LDLCALC 86 03/18/2017   TRIG 62.0 03/18/2017   CHOLHDL 4 03/18/2017   - Last TSH normal: Lab Results  Component Value Date   TSH 1.06 03/18/2017   - last eye exam was in 05/2015: + DR.  - no numbness and tingling in his feet.  ROS: Constitutional: no weight gain/no weight loss, no fatigue, no subjective hyperthermia, no subjective hypothermia Eyes: no blurry vision, no xerophthalmia ENT: no sore throat, no nodules palpated in neck, no dysphagia, no odynophagia, no hoarseness Cardiovascular: no CP/no SOB/no palpitations/no leg swelling Respiratory: no cough/no SOB/no wheezing Gastrointestinal: no N/no V/no D/no C/no acid reflux Musculoskeletal: no muscle aches/no joint aches Skin: no rashes, no hair loss Neurological: no tremors/no numbness/no tingling/no dizziness  I reviewed pt's medications, allergies, PMH, social hx,  family hx, and changes were documented in the history of present illness. Otherwise, unchanged from my initial visit note.  Past Medical History:  Diagnosis Date  . Diabetes mellitus without complication ([place])    Type I    Past Surgical History:  Procedure Laterality Date  . NECK SURGERY  07/2011   History   Social History  . Marital Status:  married     Spouse Name: N/A    Number of Children: 1   Occupational History  .  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 Smoking status: Never Smoker   . Smokeless tobacco: Current User    Types: Chew  . Alcohol Use: 0.0 oz/week    0 Not specified per week  . Drug Use: No   Social History Narrative   Married   1 stepson   Current Outpatient Medications  Medication Sig Dispense Refill  . ACCU-CHEK SOFTCLIX LANCETS lancets USE TO TEST BLOOD SUGAR 4 TIMES A DAY 200 each 11  . glucagon (GLUCAGON EMERGENCY) 1 MG injection Inject 1 mg into the muscle once as needed. 2 each prn  . glucose blood test strip Use to test blood 4 times daily as instructed. For One Touch Verio meter. 400 each 11  . insulin aspart (NOVOLOG FLEXPEN) 100 UNIT/ML FlexPen INJECT 6-14 UNITS INTO THE SKIN 3 (THREE) TIMES DAILY WITH MEALS. 45 mL 3  . insulin lispro (HUMALOG KWIKPEN) 100 UNIT/ML KiwkPen INJECT 6-14 UNITS TOTAL INTO THE SKIN 3 (THREE) TIMES DAILY. 45 pen 0  . Insulin Pen Needle (CLICKFINE PEN NEEDLES) 32G X 4 MM MISC Use 4x a day 400 each 3  . LANTUS SOLOSTAR 100 UNIT/ML Solostar Pen INJECT 35 UNITS INTO THE SKIN DAILY AT 10 PM 30 pen 1   No current facility-administered medications for this visit.    Allergies  Allergen Reactions  . Basaglar Kwikpen [Insulin Glargine] Other (See Comments)    Ineffective  . Humalog [Insulin Lispro] Other (See Comments)    Ineffective   Family history: None  PE: BP 122/90   Pulse 86   Ht '5\' 5"'  (1.651 m) Comment: measured  Wt 162 lb (73.5 kg)   SpO2 97%   BMI 26.96 kg/m  Body mass index is 26.96 kg/m. Wt Readings from Last 3 Encounters:  03/13/18 162 lb (73.5 kg)  11/10/17 157 lb 12.8 oz (71.6 kg)  07/14/17 163 lb (73.9 kg)   Constitutional: overweight, in NAD Eyes: PERRLA, EOMI, no exophthalmos ENT: moist mucous membranes, no thyromegaly, no cervical lymphadenopathy Cardiovascular: RRR, No MRG Respiratory: CTA B Gastrointestinal: abdomen soft, NT, ND, BS+ Musculoskeletal: no deformities, strength intact in all  4 Skin: moist, warm, no rashes Neurological: no tremor with outstretched hands, DTR normal in all 4  ASSESSMENT: 1. DM1, uncontrolled, with complications (background DR)  - Per  records from Dr. Gabriel Carina, patient's diabetes was never very uncontrolled, his range is 7.2-7.6%. I suspect that he still has some insulin production from his pancreas.  -He is not interested in an insulin pump.  2. Dyslipidemia  PLAN:  1. Patient with long-standing, fairly well-controlled type 1 diabetes, with worse control whenever he has to switch insulins (from Lantus to [place] and from NovoLog to Humalog).  We can continue with Lantus for now, however, before last visit, his insurance made him switch from NovoLog to Humalog and sugars  were worse.  HbA1c also returned elevated, at 7.6%.  We started the preauthorization form for his Humalog then. -His sugars are usually better during the summer and he may lose weight as he is very active.  He is keeping his sugars slightly higher during the day on purpose to avoid low CBGs while at work.  At last visit we discussed about increasing his mealtime insulin slightly to drop his prelunch sugars in the 120-130 range. - At this visit, all sugars are higher.  He had an HbA1c that was lower will be more than a month ago but at that time he was on NovoLog.  Currently on Humalog.  We discussed that we would need to stay with this insulin since NovoLog is very expensive.  We will decrease his insulin to carb ratio to improve his sugars during the day but I also suggested a change from Lantus to [place] U200 which is more concentrated, better absorbed from the site, less likely to give him lows (the fear of lows has limited our mealtime CBG management) and it is longer acting. - I advised him to:  Patient Instructions  Please change: - Lantus to [place] U200 38 units - Humalog: ICR 1:11 (decrease to 1:10 if no lows and sugars are still high) Target 120 ISF 30  Please stop at the  lab.  Please come back for a follow-up appointment in 4 months.  - today, HbA1c is 7.5% (above goal) - continue checking sugars at different times of the day - check 4x a day, rotating checks - advised for yearly eye exams >> he is not up-to-date and at last visit I strongly advised him to schedule this - We will check annual labs today - Return to clinic in 4 mo with sugar log     2. Dyslipidemia - Reviewed latest lipid panel from 02/2017: HDL slightly low, LDL at goal Lab Results  Component Value Date   CHOL 131 03/18/2017   HDL 32.90 (L) 03/18/2017   LDLCALC 86 03/18/2017   TRIG 62.0 03/18/2017   CHOLHDL 4 03/18/2017  -Not on a statin -We will recheck lipids today (ate a bisquit this am).  Component     Latest Ref Rng & Units 03/13/2018  Glucose     65 - 99 mg/dL 48 (L)  BUN     7 - 25 mg/dL 9  Creatinine     0.60 - 1.35 mg/dL 1.22  GFR, Est Non African American     > OR = 60 mL/min/1.57m 77  GFR, Est African American     > OR = 60 mL/min/1.720m90  BUN/Creatinine Ratio     6 - 22 (calc) NOT APPLICABLE  Sodium     13381 146 mmol/L 140  Potassium     3.5 - 5.3 mmol/L 5.0  Chloride     98 - 110 mmol/L 104  CO2     20 - 32 mmol/L 28  Calcium     8.6 - 10.3 mg/dL 10.3  Total Protein     6.1 - 8.1 g/dL 7.4  Albumin MSPROF     3.6 - 5.1 g/dL 4.6  Globulin     1.9 - 3.7 g/dL (calc) 2.8  AG Ratio     1.0 - 2.5 (calc) 1.6  Total Bilirubin     0.2 - 1.2 mg/dL 0.9  Alkaline phosphatase (APISO)     40 - 115 U/L 72  AST     10 - 40 U/L  17  ALT     9 - 46 U/L 14  Cholesterol     0 - 200 mg/dL 137  Triglycerides     0.0 - 149.0 mg/dL 67.0  HDL Cholesterol     >39.00 mg/dL 44.30  VLDL     0.0 - 40.0 mg/dL 13.4  LDL (calc)     0 - 99 mg/dL 79  Total CHOL/HDL Ratio      3  NonHDL      92.87  Microalb, Ur     0.0 - 1.9 mg/dL 3.5 (H)  Creatinine,U     mg/dL 279.5  MICROALB/CREAT RATIO     0.0 - 30.0 mg/g 1.2  TSH     0.35 - 4.50 uIU/mL 1.27     Labs are normal with the exception of a low glucose, at 48.  Patient behaved normally at yesterday's appointment and did not have any hypoglycemic symptoms..  We will check on him today.  Philemon Kingdom, MD PhD [facility]

## 2018-03-13 NOTE — Patient Instructions (Signed)
Please change: - Lantus to [place] U200 38 units - Humalog: ICR 1:11 (decrease to 1:10 if no lows and sugars are still high) Target 120 ISF 30  Please stop at the lab.  Please come back for a follow-up appointment in 4 months.

## 2018-03-14 ENCOUNTER — Telehealth: Payer: Self-pay

## 2018-03-14 LAB — COMPLETE METABOLIC PANEL WITH GFR
AG Ratio: 1.6 (calc) (ref 1.0–2.5)
ALBUMIN MSPROF: 4.6 g/dL (ref 3.6–5.1)
ALT: 14 U/L (ref 9–46)
AST: 17 U/L (ref 10–40)
Alkaline phosphatase (APISO): 72 U/L (ref 40–115)
BUN: 9 mg/dL (ref 7–25)
CALCIUM: 10.3 mg/dL (ref 8.6–10.3)
CHLORIDE: 104 mmol/L (ref 98–110)
CO2: 28 mmol/L (ref 20–32)
CREATININE: 1.22 mg/dL (ref 0.60–1.35)
GFR, EST AFRICAN AMERICAN: 90 mL/min/{1.73_m2} (ref >=60)
GFR, EST NON AFRICAN AMERICAN: 77 mL/min/{1.73_m2} (ref >=60)
GLUCOSE: 48 mg/dL — AB (ref 65–99)
Globulin: 2.8 g/dL (calc) (ref 1.9–3.7)
Potassium: 5 mmol/L (ref 3.5–5.3)
Sodium: 140 mmol/L (ref 135–146)
TOTAL PROTEIN: 7.4 g/dL (ref 6.1–8.1)
Total Bilirubin: 0.9 mg/dL (ref 0.2–1.2)

## 2018-03-14 NOTE — Telephone Encounter (Signed)
-----   Message from Carlus Pavlovristina Gherghe, MD sent at 03/14/2018  9:47 AM EST ----- Efraim KaufmannMelissa, can you please call pt: Labs are all normal, with the exception of a very low glucose, at 48.  How is he doing?  Can he explain the low blood sugar?  I know he just ate a biscuit before coming to the appointment yesterday.  Please let me know.

## 2018-03-14 NOTE — Telephone Encounter (Signed)
LMTCB to discuss note below 

## 2018-03-15 NOTE — Telephone Encounter (Signed)
Spoke to patient and gave results, he states no idea why the low glucose and he is feeling normal.

## 2018-03-15 NOTE — Telephone Encounter (Signed)
Spoke to patient, he states

## 2018-03-29 ENCOUNTER — Telehealth: Payer: Self-pay | Admitting: Internal Medicine

## 2018-03-29 NOTE — Telephone Encounter (Signed)
Patient has called in regards to [employer] is going to fax a prior auth for   Insulin Degludec (TRESIBA FLEXTOUCH) 200 UNIT/ML SOPN   Patient always wants to know if we can include Novolog and Lancets in the prior auth also. Please Advise, thanks

## 2018-03-29 NOTE — Telephone Encounter (Signed)
PA has already been started for the [place].  Looking through patient's chart we did a PA for the Novolog which was approved but his copay was 600.00 so he opted not to get it.

## 2018-06-30 ENCOUNTER — Telehealth: Payer: Self-pay | Admitting: Internal Medicine

## 2018-06-30 NOTE — Telephone Encounter (Signed)
MEDICATION: One Touch Verio Test Strips  PHARMACY:  [facility]  [address] in [place]  IS THIS A 90 DAY SUPPLY :  unknown  IS PATIENT OUT OF MEDICATION: yes  IF NOT; HOW MUCH IS LEFT:   LAST APPOINTMENT DATE: @12 /02/2018  NEXT APPOINTMENT DATE:@3 /26/2020  DO WE HAVE YOUR PERMISSION TO LEAVE A DETAILED MESSAGE: yes  OTHER COMMENTS:    **Let patient know to contact pharmacy at the end of the day to make sure medication is ready. **  ** Please notify patient to allow 48-72 hours to process**  **Encourage patient to contact the pharmacy for refills or they can request refills through [facility]**

## 2018-07-12 ENCOUNTER — Other Ambulatory Visit: Payer: Self-pay

## 2018-07-13 ENCOUNTER — Ambulatory Visit: Payer: PRIVATE HEALTH INSURANCE | Admitting: Internal Medicine

## 2018-07-13 ENCOUNTER — Encounter: Payer: Self-pay | Admitting: Internal Medicine

## 2018-07-13 ENCOUNTER — Other Ambulatory Visit: Payer: Self-pay

## 2018-07-13 VITALS — BP 118/80 | HR 88 | Temp 98.6°F | Ht 65.0 in | Wt 149.0 lb

## 2018-07-13 DIAGNOSIS — E103299 Type 1 diabetes mellitus with mild nonproliferative diabetic retinopathy without macular edema, unspecified eye: Secondary | ICD-10-CM

## 2018-07-13 DIAGNOSIS — E785 Hyperlipidemia, unspecified: Secondary | ICD-10-CM | POA: Diagnosis not present

## 2018-07-13 LAB — POCT GLYCOSYLATED HEMOGLOBIN (HGB A1C): Hemoglobin A1C: 7.8 % — AB (ref 4.0–5.6)

## 2018-07-13 MED ORDER — GLUCOSE BLOOD VI STRP: ORAL_STRIP | 12 refills | Status: DC

## 2018-07-13 NOTE — Addendum Note (Signed)
Addended by: Darliss Ridgel I on: 07/13/2018 09:23 AM   Modules accepted: Orders

## 2018-07-13 NOTE — Patient Instructions (Addendum)
Please continue: - Tresiba 38 units daily  Please change: - Humalog: ICR 1:11 >> 1:10 Target 120 ISF 30  This means: - 120-150: + 1 unit  - 151-180: + 2 units  - 181-210: + 3 units  - 210-240: + 4 units  - >240: + 5 units  Please come back for a follow-up appointment in 4 months.   Carbohydrate Counting for Diabetes Mellitus, Adult  Carbohydrate counting is a method of keeping track of how many carbohydrates you eat. Eating carbohydrates naturally increases the amount of sugar (glucose) in the blood. Counting how many carbohydrates you eat helps keep your blood glucose within normal limits, which helps you manage your diabetes (diabetes mellitus). It is important to know how many carbohydrates you can safely have in each meal. This is different for every person. A diet and nutrition specialist (registered dietitian) can help you make a meal plan and calculate how many carbohydrates you should have at each meal and snack. Carbohydrates are found in the following foods:  Grains, such as breads and cereals.  Dried beans and soy products.  Starchy vegetables, such as potatoes, peas, and corn.  Fruit and fruit juices.  Milk and yogurt.  Sweets and snack foods, such as cake, cookies, candy, chips, and soft drinks. How do I count carbohydrates? There are two ways to count carbohydrates in food. You can use either of the methods or a combination of both. Reading "Nutrition Facts" on packaged food The "Nutrition Facts" list is included on the labels of almost all packaged foods and beverages in the [place] It includes:  The serving size.  Information about nutrients in each serving, including the grams (g) of carbohydrate per serving. To use the "Nutrition Facts":  Decide how many servings you will have.  Multiply the number of servings by the number of carbohydrates per serving.  The resulting number is the total amount of carbohydrates that you will be having. Learning  standard serving sizes of other foods When you eat carbohydrate foods that are not packaged or do not include "Nutrition Facts" on the label, you need to measure the servings in order to count the amount of carbohydrates:  Measure the foods that you will eat with a food scale or measuring cup, if needed.  Decide how many standard-size servings you will eat.  Multiply the number of servings by 15. Most carbohydrate-rich foods have about 15 g of carbohydrates per serving. ? For example, if you eat 8 oz (170 g) of strawberries, you will have eaten 2 servings and 30 g of carbohydrates (2 servings x 15 g = 30 g).  For foods that have more than one food mixed, such as soups and casseroles, you must count the carbohydrates in each food that is included. The following list contains standard serving sizes of common carbohydrate-rich foods. Each of these servings has about 15 g of carbohydrates:   hamburger bun or  English muffin.   oz (15 mL) syrup.   oz (14 g) jelly.  1 slice of bread.  1 six-inch tortilla.  3 oz (85 g) cooked rice or pasta.  4 oz (113 g) cooked dried beans.  4 oz (113 g) starchy vegetable, such as peas, corn, or potatoes.  4 oz (113 g) hot cereal.  4 oz (113 g) mashed potatoes or  of a large baked potato.  4 oz (113 g) canned or frozen fruit.  4 oz (120 mL) fruit juice.  4-6 crackers.  6 chicken nuggets.  6 oz (170 g) unsweetened dry cereal.  6 oz (170 g) plain fat-free yogurt or yogurt sweetened with artificial sweeteners.  8 oz (240 mL) milk.  8 oz (170 g) fresh fruit or one small piece of fruit.  24 oz (680 g) popped popcorn. Example of carbohydrate counting Sample meal  3 oz (85 g) chicken breast.  6 oz (170 g) brown rice.  4 oz (113 g) corn.  8 oz (240 mL) milk.  8 oz (170 g) strawberries with sugar-free whipped topping. Carbohydrate calculation 1. Identify the foods that contain  carbohydrates: ? Rice. ? Corn. ? Milk. ? Strawberries. 2. Calculate how many servings you have of each food: ? 2 servings rice. ? 1 serving corn. ? 1 serving milk. ? 1 serving strawberries. 3. Multiply each number of servings by 15 g: ? 2 servings rice x 15 g = 30 g. ? 1 serving corn x 15 g = 15 g. ? 1 serving milk x 15 g = 15 g. ? 1 serving strawberries x 15 g = 15 g. 4. Add together all of the amounts to find the total grams of carbohydrates eaten: ? 30 g + 15 g + 15 g + 15 g = 75 g of carbohydrates total. Summary  Carbohydrate counting is a method of keeping track of how many carbohydrates you eat.  Eating carbohydrates naturally increases the amount of sugar (glucose) in the blood.  Counting how many carbohydrates you eat helps keep your blood glucose within normal limits, which helps you manage your diabetes.  A diet and nutrition specialist (registered dietitian) can help you make a meal plan and calculate how many carbohydrates you should have at each meal and snack. This information is not intended to replace advice given to you by your health care provider. Make sure you discuss any questions you have with your health care provider. Document Released: 04/05/2005 Document Revised: 10/13/2016 Document Reviewed: 09/17/2015 Elsevier Interactive Patient Education  2019 [employer].

## 2018-07-13 NOTE — Progress Notes (Signed)
Patient ID: Brian Braun, male   DOB: May 08, 1985, 33 y.o.   MRN: [id number]   HPI: Brian Braun is a 33 y.o.-year-old male, returning for f/u for DM1, dx 1996, uncontrolled, with long-term complications (DR). Last visit 4 months ago.  He continues on Humalog, which is not as efficient for him as NovoLog, but he had a very high co-pay for NovoLog ($600) while for Humalog he only has to pay $25.  Last hemoglobin A1c was: 01/2018: HbA1c (work): 6.8% (Novolog) Lab Results  Component Value Date   HGBA1C 7.5 (A) 03/13/2018   HGBA1C 7.6 (A) 11/10/2017   HGBA1C 7.1 07/14/2017  03/25/2016: HbA1c 6.2% 10/11/2013: 7.6% 06/13/2013: 7.3%  He was previously on the Medtronic MiniMed insulin pump up to 2011, but he developed a DKA episode and he is now not interested in restarting the pump.  He is now on: - Lantus >> Tresiba 38 units daily - Humalog: ICR 1:11  -however, upon questioning today, he is not calculating this correctly and he is not using his Accu-Chek expert meter, either Target 120 ISF 30 He has an Accu-Chek expert meter >> One Touch Verio  Pt checks his sugars 4X a day -he again does not bring a log: - am: 120-130 >> 120-130 >> 130-150 >> 80-130, 220 seldom - 2h after b'fast: n/c - before lunch:  140-160 >> 80, 150-180 >> 130-160 - 2h after lunch: n/c - before dinner:  100-140 >> 150-180 >> 120-170 - 2h after dinner: n/c - bedtime:  120-150 >> up to 150 >> 140-150 >> 110-160 - nighttime: before eating - 140-150s >> n/c Lowest sugar was 80 >> 70; he has hypoglycemia awareness in the 60s.  He does have a non-expired glucagon kit at home. Highest sugar was 250 >> 300 (? Reason). He had one DKA admission in 2011.  Pt's meals are: - Breakfast: oatmeal or bacon and eggs + cheese biscuit - Lunch: sandwich or hamburger - Dinner: grilled chicken and rice; spaghetti; sloppy joes - Snacks:PB crackers at 10 am; cheeze it's   -No history of CKD, last BUN/creatinine: Lab Results   Component Value Date   BUN 9 03/13/2018   CREATININE 1.22 03/13/2018   Lab Results  Component Value Date   GFR 54.44 (L) 03/25/2015   GFR 78.07 04/09/2014   No MAU: Lab Results  Component Value Date   MICRALBCREAT 1.2 03/13/2018   MICRALBCREAT 1.3 03/18/2017   MICRALBCREAT 2.1 03/29/2016   MICRALBCREAT 0.7 03/25/2015   MICRALBCREAT 2.0 04/09/2014   -+ History of dyslipidemia; latest lipid panel: Lab Results  Component Value Date   CHOL 137 03/13/2018   HDL 44.30 03/13/2018   LDLCALC 79 03/13/2018   TRIG 67.0 03/13/2018   CHOLHDL 3 03/13/2018   - Last TSH normal at last visit: Lab Results  Component Value Date   TSH 1.27 03/13/2018   - last eye exam was in 05/2015: + DR -No numbness and tingling in his feet.  ROS: Constitutional: no weight gain/+ weight loss, no fatigue, no subjective hyperthermia, no subjective hypothermia Eyes: no blurry vision, no xerophthalmia ENT: no sore throat, no nodules palpated in neck, no dysphagia, no odynophagia, no hoarseness Cardiovascular: no CP/no SOB/no palpitations/no leg swelling Respiratory: no cough/no SOB/no wheezing Gastrointestinal: no N/no V/no D/no C/no acid reflux Musculoskeletal: no muscle aches/no joint aches Skin: no rashes, no hair loss Neurological: no tremors/no numbness/no tingling/no dizziness  I reviewed pt's medications, allergies, PMH, social hx, family hx, and changes were documented in the history  of present illness. Otherwise, unchanged from my initial visit note.  Past Medical History:  Diagnosis Date  . Diabetes mellitus without complication ([place])    Type I    Past Surgical History:  Procedure Laterality Date  . NECK SURGERY  07/2011   History   Social History  . Marital Status:  married     Spouse Name: N/A    Number of Children: 1   Occupational History  .  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 Smoking status: Never Smoker   . Smokeless tobacco: Current User    Types: Chew  . Alcohol Use: 0.0 oz/week    0 Not specified per week  . Drug Use: No   Social History Narrative   Married   1 stepson   Current Outpatient Medications  Medication Sig Dispense Refill  . ACCU-CHEK SOFTCLIX LANCETS lancets USE TO TEST BLOOD SUGAR 4 TIMES A DAY 200 each 11  . glucagon (GLUCAGON EMERGENCY) 1 MG injection Inject 1 mg into the muscle once as needed. 2 each prn  . glucose blood test strip Use to test blood 4 times daily as instructed. For One Touch Verio meter. 400 each 11  . insulin aspart (NOVOLOG FLEXPEN) 100 UNIT/ML FlexPen INJECT 6-14 UNITS INTO THE SKIN 3 (THREE) TIMES DAILY WITH MEALS. 45 mL 3  . Insulin Degludec (TRESIBA FLEXTOUCH) 200 UNIT/ML SOPN Inject 38 Units into the skin daily. 3 pen 5  . insulin lispro (HUMALOG KWIKPEN) 100 UNIT/ML KiwkPen INJECT 6-14 UNITS TOTAL INTO THE SKIN 3 (THREE) TIMES DAILY. 45 pen 0  . Insulin Pen Needle (CLICKFINE PEN NEEDLES) 32G X 4 MM MISC Use 4x a day 400 each 3  . LANTUS SOLOSTAR 100 UNIT/ML Solostar Pen INJECT 35 UNITS INTO THE SKIN DAILY AT 10 PM 30 pen 1   No current facility-administered medications for this visit.    Allergies  Allergen Reactions  . Basaglar Kwikpen [Insulin Glargine] Other (See Comments)    Ineffective  . Humalog [Insulin Lispro] Other (See Comments)    Ineffective   Family history: None  PE: BP 118/80   Pulse 88   Temp 98.6 F (37 C)   Ht _0  (1.651 m)   Wt 149 lb (67.6 kg)   SpO2 99%   BMI 24.79 kg/m  Body mass index is 24.79 kg/m. Wt Readings from Last 3 Encounters:  07/13/18 149 lb (67.6 kg)  03/13/18 162 lb (73.5 kg)  11/10/17 157 lb 12.8 oz (71.6 kg)   Constitutional: overweight, in NAD Eyes: PERRLA, EOMI, no exophthalmos ENT: moist mucous membranes, no thyromegaly, no cervical lymphadenopathy Cardiovascular: RRR, No MRG Respiratory: CTA  B Gastrointestinal: abdomen soft, NT, ND, BS+ Musculoskeletal: no deformities, strength intact in all 4 Skin: moist, warm, no rashes Neurological: no tremor with outstretched hands, DTR normal in all 4  ASSESSMENT: 1. DM1, uncontrolled, with complications (background DR)  - Per  records from Dr. Gabriel Carina, patient's diabetes was never very uncontrolled, his range is 7.2-7.6%. I suspect that he still has some insulin production from his pancreas.  -He is not interested in an insulin pump.  2. Dyslipidemia  3. DR  PLAN:  1. Patient with longstanding, fairly well-controlled type 1 diabetes, with worse control whenever his insurance forces him to switch insulins (from Lantus to [place] and from NovoLog to Humalog.  At  last visit, he was on Humalog and had higher blood sugars at all times of the day.  At that time, we increased his mealtime doses by decreasing his ICR and target, and I also advised him to change his long-acting insulin to [place].  He was able to obtain this. -At today's visit, his sugars are better compared to last visit, however, HbA1c is 7.8% (higher) -Upon questioning, he is not using his Accu-Chek expert glucometer which has the capability of calculating his Humalog insulin doses and he is also not calculating the doses correctly.  We did several exercises to see if he knows how to calculate his doses and he did not calculate correctly.  We therefore spent a significant amount of the appointment practicing dose calculations and I also gave him a different insulin to carb ratio to simplify the calculations.  I also spelt out his sliding scale and he demonstrated understanding about how to use it.  Also, gave him more guidance about carb counting (please see after visit summary) - I advised him to:  Patient Instructions  Please continue: - Tresiba 38 units daily  Please change: - Humalog: ICR 1:11 >> 1:10 Target 120 ISF 30  This means: - 120-150: + 1 unit  - 151-180: + 2  units  - 181-210: + 3 units  - 210-240: + 4 units  - >240: + 5 units  Please come back for a follow-up appointment in 4 months.  - continue checking sugars at different times of the day - check 3-4x a day, rotating checks - Again strongly advised him to schedule a new yearly eye exam;  he is due - Return to clinic in 4 mo with sugar log      2. Dyslipidemia - Reviewed latest lipid panel from last OV: All fractions at goal now Lab Results  Component Value Date   CHOL 137 03/13/2018   HDL 44.30 03/13/2018   LDLCALC 79 03/13/2018   TRIG 67.0 03/13/2018   CHOLHDL 3 03/13/2018  -Not on a statin.  - time spent with the patient: 30 min, of which >50% was spent in reviewing his sugars at home, insulin doses, revised protocols for calculating insulin doses correctly, reviewing carb counting, reviewing previous labs and developing a plan to avoid hypo- and hyper-glycemia.    Philemon Kingdom, MD PhD [facility]

## 2018-10-22 IMAGING — CT CT RENAL STONE PROTOCOL
3 of 4 series · 9 of 46 positions shown, 16 images · non-contrast
Comparison: None.

CLINICAL DATA: Sudden onset of left flank pain.

EXAM:
CT ABDOMEN AND PELVIS WITHOUT CONTRAST
TECHNIQUE: Multidetector CT imaging of the abdomen and pelvis was performed
following the standard protocol without IV contrast.

[Series 4: lung bases · axial · 0.66mm/px · z∈[-723,-638]mm · 5 of 27 slices shown, 10 images]
[im 5/27  soft-tissue]
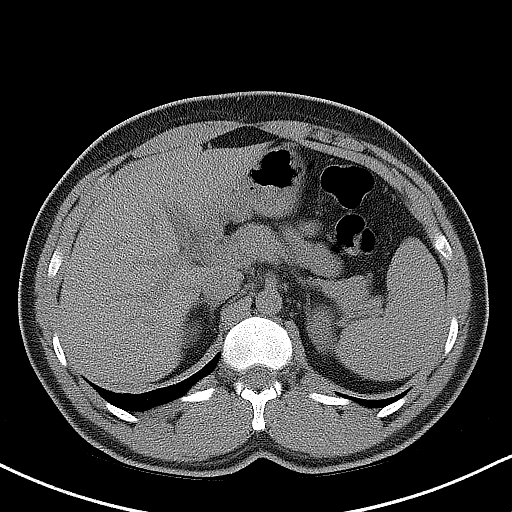
[im 5/27  bone]
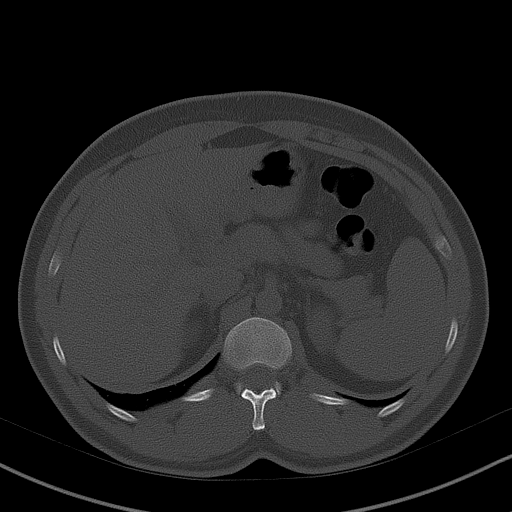
[im 9/27  soft-tissue]
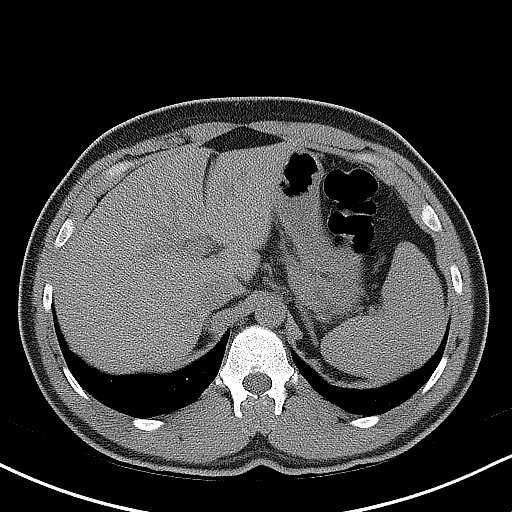
[im 9/27  lung]
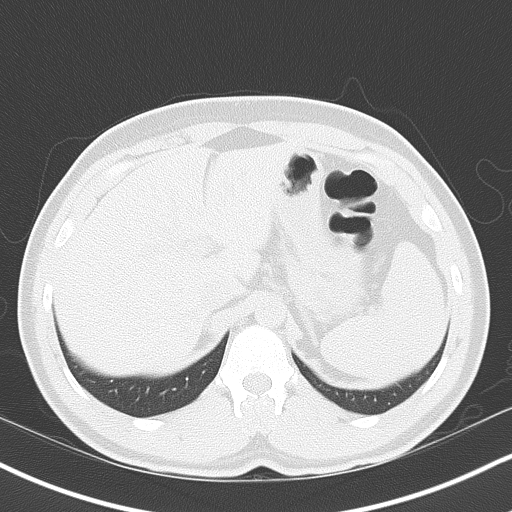
[im 14/27  soft-tissue]
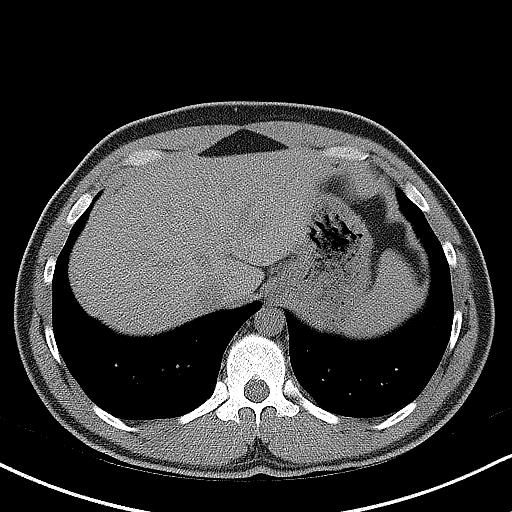
[im 14/27  lung]
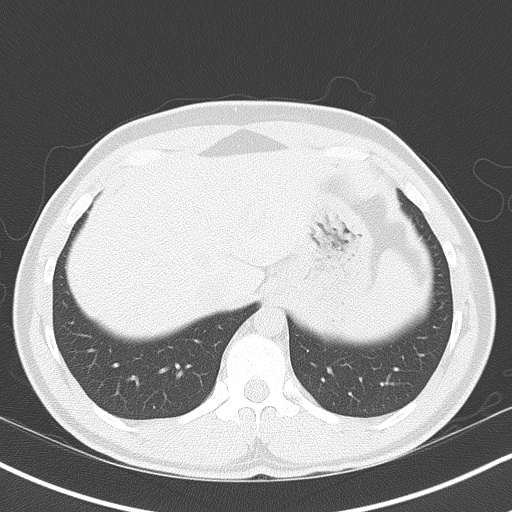
[im 18/27  soft-tissue]
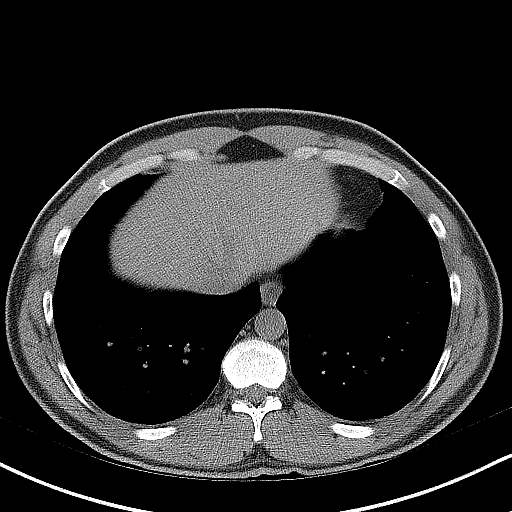
[im 18/27  lung]
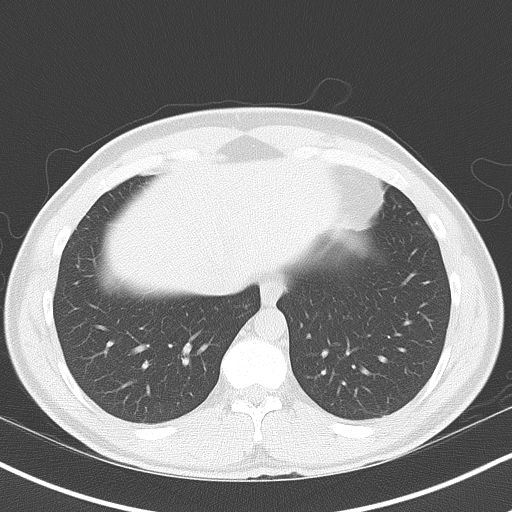
[im 22/27  soft-tissue]
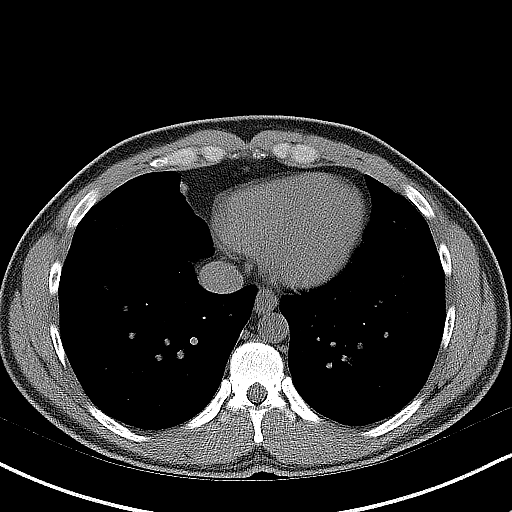
[im 22/27  lung]
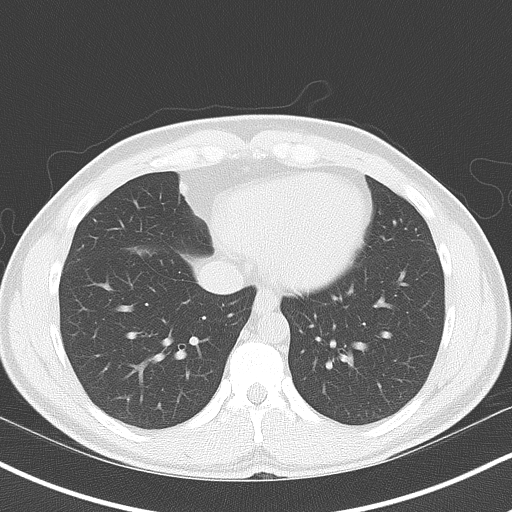

[Series 5: coronal · coronal · 0.69mm/px · 3 of 131 slices shown, 4 images]
[im 44/131  soft-tissue]
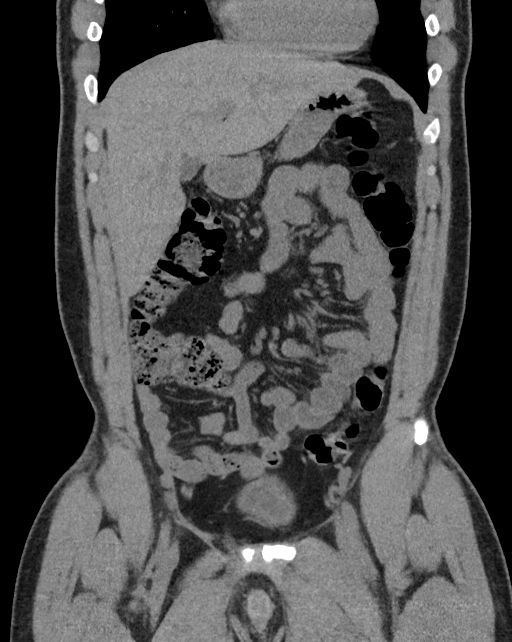
[im 58/131  soft-tissue]
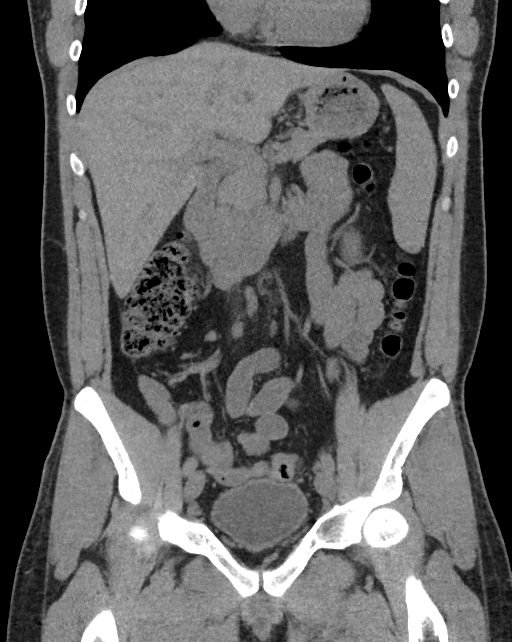
[im 58/131  bone]
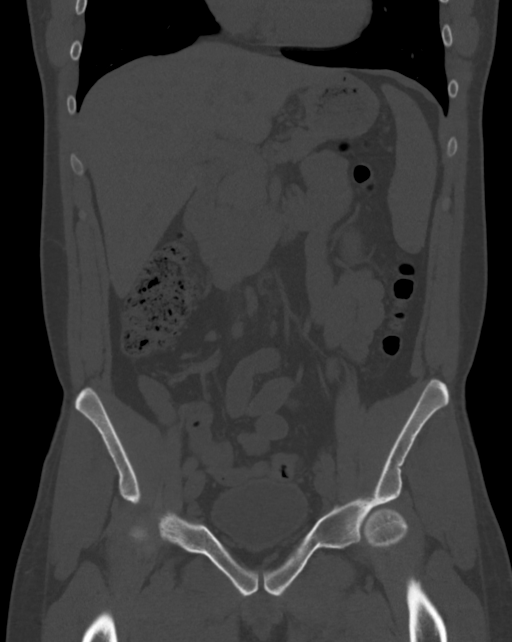
[im 73/131  soft-tissue]
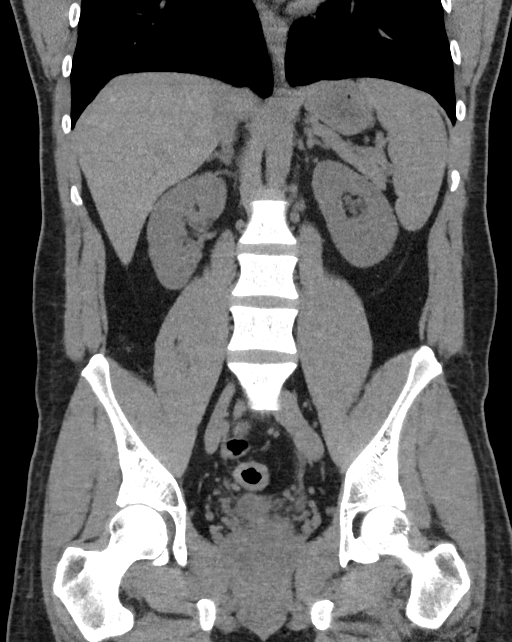

[Series 6: sagittal · sagittal · 0.51mm/px · 1 of 178 slices shown, 2 images]
[im 60/178  soft-tissue]
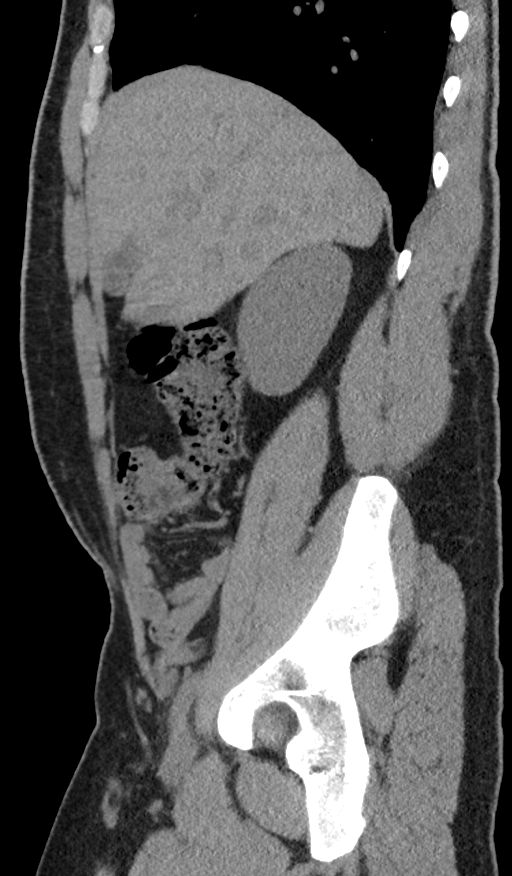
[im 60/178  bone]
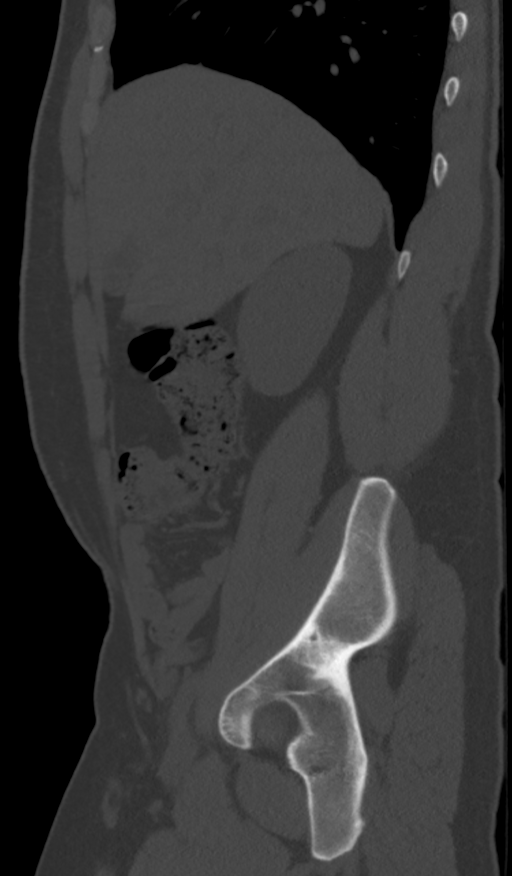

[9 of 46 positions shown; findings below may reference images not displayed]

FINDINGS: Lower chest: The lung bases are clear.

Hepatobiliary: No focal hepatic lesion allowing for lack contrast.
Gallbladder partially distended, there is a Phrygian cap. No biliary
dilatation.

Pancreas: No ductal dilatation or inflammation.

Spleen: Normal in size without focal abnormality.

Adrenals/Urinary Tract: No adrenal nodule.

Obstructing 4 mm stone at the left ureterovesicular junction with
mild hydroureteronephrosis and minimal perinephric edema. There are
at least 5 additional nonobstructing stones in the left kidney.

No right hydronephrosis or hydroureter. Two nonobstructing stones in
the right kidney.

Urinary bladder is minimally distended, no bladder stone.

Stomach/Bowel: Stomach is within normal limits. Appendix not
confidently visualized. No evidence of bowel wall thickening,
distention, or inflammatory changes.

Vascular/Lymphatic: Normal caliber abdominal aorta. No
abdominopelvic adenopathy.

Reproductive: Prostate is unremarkable.

Other: No free air, free fluid, or intra-abdominal fluid collection.

Musculoskeletal: There are no acute or suspicious osseous
abnormalities.
IMPRESSION: 1. Obstructing 4 mm stone at the left ureterovesicular junction with
mild hydronephrosis.
2. Additional nonobstructing stones in both kidneys.

## 2018-12-07 ENCOUNTER — Other Ambulatory Visit: Payer: Self-pay

## 2018-12-11 ENCOUNTER — Ambulatory Visit: Payer: PRIVATE HEALTH INSURANCE | Admitting: Internal Medicine

## 2018-12-11 ENCOUNTER — Other Ambulatory Visit: Payer: Self-pay

## 2018-12-11 ENCOUNTER — Encounter: Payer: Self-pay | Admitting: Internal Medicine

## 2018-12-11 VITALS — BP 128/88 | HR 68 | Ht 65.0 in | Wt 169.0 lb

## 2018-12-11 DIAGNOSIS — E785 Hyperlipidemia, unspecified: Secondary | ICD-10-CM | POA: Diagnosis not present

## 2018-12-11 DIAGNOSIS — E103299 Type 1 diabetes mellitus with mild nonproliferative diabetic retinopathy without macular edema, unspecified eye: Secondary | ICD-10-CM

## 2018-12-11 LAB — POCT GLYCOSYLATED HEMOGLOBIN (HGB A1C): Hemoglobin A1C: 7.6 % — AB (ref 4.0–5.6)

## 2018-12-11 NOTE — Addendum Note (Signed)
Addended by: Cardell Peach I on: 12/11/2018 09:48 AM   Modules accepted: Orders

## 2018-12-11 NOTE — Progress Notes (Signed)
Patient ID: Brian Braun, male   DOB: 1985-05-17, 33 y.o.   MRN: [id number]   HPI: Brian Braun is a 33 y.o.-year-old male, returning for f/u for DM1, dx 1996, uncontrolled, with long-term complications (DR). Last visit 5 months ago.  Latest HbA1c level: Lab Results  Component Value Date   HGBA1C 7.8 (A) 07/13/2018   HGBA1C 7.5 (A) 03/13/2018   HGBA1C 7.6 (A) 11/10/2017  01/2018: HbA1c (work): 6.8% (Novolog) 03/25/2016: HbA1c 6.2% 10/11/2013: 7.6% 06/13/2013: 7.3%  He was previously on the Medtronic MiniMed insulin pump up to 2011, but he developed a DKA episode and he is now not interested in restarting her pump.  He is now on: - Lantus >> Tresiba 38 units daily - Humalog: ICR 1:10 >> misunderstood instr.: uses 10 units +/- 2-3 Target 120 ISF 30 Glucometer: Accu-Chek expert meter >> One Touch Verio He continues on Humalog, which is not as efficient for him as NovoLog, but he had a very high co-pay for NovoLog ($600) while for Humalog he only has to pay $25.  Pt checks his sugars 4 times a day.  He does not bring a log: - am: 120-130 >> 120-130 >> 130-150 >> 80-130, 220 >> 130-180 - 2h after b'fast: n/c - before lunch:  140-160 >> 80, 150-180 >> 130-160 >> 150-160 - 2h after lunch: n/c - before dinner:  100-140 >> 150-180 >> 120-170 >> 130-170 - 2h after dinner: n/c - bedtime:  120-150 >> up to 150 >> 140-150 >> 110-160 >> 130-170, 250 (eats PB + crackers if sugars >150) - nighttime: before eating - 140-150s >> n/c Lowest sugar was 80 >> 70 >> 85; he has hypoglycemia awareness in the 60s.  He has a non-expired glucagon kit at home. Highest sugar was 250 >> 300 (? Reason) >> 250. He had one DKA admission in 2011.  Pt's meals are: - Breakfast: oatmeal or bacon and eggs + cheese biscuit - Lunch: sandwich or hamburger - Dinner: grilled chicken and rice; spaghetti; sloppy joes - Snacks:PB crackers at 10 am; cheeze it's   -No history of CKD, last BUN/creatinine: Lab Results   Component Value Date   BUN 9 03/13/2018   CREATININE 1.22 03/13/2018   Lab Results  Component Value Date   GFR 54.44 (L) 03/25/2015   GFR 78.07 04/09/2014   No MAU: Lab Results  Component Value Date   MICRALBCREAT 1.2 03/13/2018   MICRALBCREAT 1.3 03/18/2017   MICRALBCREAT 2.1 03/29/2016   MICRALBCREAT 0.7 03/25/2015   MICRALBCREAT 2.0 04/09/2014   -He does have a history of dyslipidemia; latest lipid panel: Lab Results  Component Value Date   CHOL 137 03/13/2018   HDL 44.30 03/13/2018   LDLCALC 79 03/13/2018   TRIG 67.0 03/13/2018   CHOLHDL 3 03/13/2018   -Latest TSH was normal: Lab Results  Component Value Date   TSH 1.27 03/13/2018   - last eye exam was in 05/2015: + DR - no numbness and tingling in his feet.  ROS: Constitutional: + weight gain/no weight loss, no fatigue, no subjective hyperthermia, no subjective hypothermia Eyes: no blurry vision, no xerophthalmia ENT: no sore throat, no nodules palpated in neck, no dysphagia, no odynophagia, no hoarseness Cardiovascular: no CP/no SOB/no palpitations/no leg swelling Respiratory: no cough/no SOB/no wheezing Gastrointestinal: no N/no V/no D/no C/no acid reflux Musculoskeletal: no muscle aches/no joint aches Skin: no rashes, no hair loss Neurological: no tremors/no numbness/no tingling/no dizziness  I reviewed pt's medications, allergies, PMH, social hx, family hx, and changes were  documented in the history of present illness. Otherwise, unchanged from my initial visit note.  Past Medical History:  Diagnosis Date  . Diabetes mellitus without complication ([place])    Type I    Past Surgical History:  Procedure Laterality Date  . NECK SURGERY  07/2011   History   Social History  . Marital Status:  married     Spouse Name: N/A    Number of Children: 1   Occupational History  .  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 Smoking status: Never Smoker   . Smokeless tobacco: Current User    Types: Chew  . Alcohol Use: 0.0 oz/week    0 Not specified per week  . Drug Use: No   Social History Narrative   Married   1 stepson   Current Outpatient Medications  Medication Sig Dispense Refill  . ACCU-CHEK SOFTCLIX LANCETS lancets USE TO TEST BLOOD SUGAR 4 TIMES A DAY 200 each 11  . glucagon (GLUCAGON EMERGENCY) 1 MG injection Inject 1 mg into the muscle once as needed. 2 each prn  . glucose blood (ONETOUCH VERIO) test strip Use to check blood sugar 4 times a day 400 each 12  . insulin aspart (NOVOLOG FLEXPEN) 100 UNIT/ML FlexPen INJECT 6-14 UNITS INTO THE SKIN 3 (THREE) TIMES DAILY WITH MEALS. 45 mL 3  . Insulin Degludec (TRESIBA FLEXTOUCH) 200 UNIT/ML SOPN Inject 38 Units into the skin daily. 3 pen 5  . insulin lispro (HUMALOG KWIKPEN) 100 UNIT/ML KiwkPen INJECT 6-14 UNITS TOTAL INTO THE SKIN 3 (THREE) TIMES DAILY. 45 pen 0  . Insulin Pen Needle (CLICKFINE PEN NEEDLES) 32G X 4 MM MISC Use 4x a day 400 each 3  . LANTUS SOLOSTAR 100 UNIT/ML Solostar Pen INJECT 35 UNITS INTO THE SKIN DAILY AT 10 PM (Patient not taking: Reported on 07/13/2018) 30 pen 1   No current facility-administered medications for this visit.    Allergies  Allergen Reactions  . Basaglar Kwikpen [Insulin Glargine] Other (See Comments)    Ineffective  . Humalog [Insulin Lispro] Other (See Comments)    Ineffective   Family history: None  PE: BP 128/88   Pulse 68   Ht _0  (1.651 m)   Wt 169 lb (76.7 kg)   SpO2 98%   BMI 28.12 kg/m  Body mass index is 28.12 kg/m. Wt Readings from Last 3 Encounters:  12/11/18 169 lb (76.7 kg)  07/13/18 149 lb (67.6 kg)  03/13/18 162 lb (73.5 kg)   Constitutional: Normal weight, in NAD Eyes: PERRLA, EOMI, no exophthalmos ENT: moist mucous membranes, no thyromegaly, no cervical lymphadenopathy Cardiovascular: RRR, No MRG Respiratory: CTA  B Gastrointestinal: abdomen soft, NT, ND, BS+ Musculoskeletal: no deformities, strength intact in all 4 Skin: moist, warm, no rashes Neurological: no tremor with outstretched hands, DTR normal in all 4  ASSESSMENT: 1. DM1, uncontrolled, with complications (background DR)  - Per  records from Dr. Gabriel Carina, patient's diabetes was never very uncontrolled, his range is 7.2-7.6%. I suspect that he still has some insulin production from his pancreas.  -He is not interested in an insulin pump.  2. Dyslipidemia  3. DR  PLAN:  1. Patient with longstanding, fairly well-controlled type 1 diabetes, with worse control whenever his insurance forces him to switch insulins (from Lantus to [place] and from NovoLog to Humalog).  He is  currently on Tresiba, which works well for him. -At last visit, HbA1c was higher, at 7.8%.  At that time, upon questioning, he was not using his Accu-Chek expert glucometer which had the capability of calculating mealtime insulin doses and he was also not calculating the doses correctly.  We exercised how to calculate the doses and adjusted his insulin to carb ratio for easier calculation.  I also gave him more guidance about carb counting and gave him a detailed instruction about how to use the insulin sensitivity factor. -At this visit, we verified together how he is using his insulin to carb ratio and he again has not been using it correctly.  He misunderstood instructions about using a 1-10 insulin to carb ratio and is actually using 10 units of insulin before every meal +/- sliding scale.  He also adjusts the dose down if it is hot outside and he anticipates that he will be active and sweats more (he is mowing lawns).  However, he also tells me that he has a snack at bedtime if sugars are higher than 150 and now his a.m. sugars are higher than before, above target, between 130 and 180.  I strongly advised him to stop eating a snack at bedtime unless his sugars are low.  I do not  feel that he needs other changes for now but we did discuss that I would consider a good HbA1c target for him 7 to 7.4% - I advised him to:  Patient Instructions  Please continue: - Tresiba 38 units daily - Humalog: 10 units before meals + sliding scale Target 120 ISF 30  This means: - 120-150: + 1 unit  - 151-180: + 2 units  - 181-210: + 3 units  - 210-240: + 4 units  - >240: + 5 units  Stop the snack at night unless sugars <80.  Please come back for a follow-up appointment in 4 months  - we checked his HbA1c: 7.6% (higher) - advised to check sugars at different times of the day - 3x a day, rotating check times - advised for yearly eye exams >> he is not UTD -advised to schedule - return to clinic in 4 months      2.  Dyslipidemia -Reviewed latest lipid panel from 02/2018: All fractions at goal: Lab Results  Component Value Date   CHOL 137 03/13/2018   HDL 44.30 03/13/2018   LDLCALC 79 03/13/2018   TRIG 67.0 03/13/2018   CHOLHDL 3 03/13/2018  -He is not on a statin.   Philemon Kingdom, MD PhD [facility] Endocrinology

## 2018-12-11 NOTE — Patient Instructions (Addendum)
Please continue: - Tresiba 38 units daily - Humalog: 10 units before meals + sliding scale Target 120 ISF 30  This means: - 120-150: + 1 unit  - 151-180: + 2 units  - 181-210: + 3 units  - 210-240: + 4 units  - >240: + 5 units  Stop the snack at night unless sugars <80.  Please come back for a follow-up appointment in 4 months.

## 2019-01-15 ENCOUNTER — Telehealth: Payer: Self-pay | Admitting: Internal Medicine

## 2019-01-15 MED ORDER — CLICKFINE PEN NEEDLES 32G X 4 MM MISC: 3 refills | Status: DC

## 2019-01-15 NOTE — Telephone Encounter (Signed)
MEDICATION:  Pin Needles for Insulin  Pens  PHARMACY:  [facility] [place] on [address] :   IS PATIENT OUT OF MEDICATION: yes  IF NOT; HOW MUCH IS LEFT:   LAST APPOINTMENT DATE: @8 /24/2020  NEXT APPOINTMENT DATE:@12 /22/2020  DO WE HAVE YOUR PERMISSION TO LEAVE A DETAILED MESSAGE: yes  OTHER COMMENTS:    **Let patient know to contact pharmacy at the end of the day to make sure medication is ready. **  ** Please notify patient to allow 48-72 hours to process**  **Encourage patient to contact the pharmacy for refills or they can request refills through [facility]**

## 2019-01-15 NOTE — Telephone Encounter (Signed)
Refill sent.

## 2019-02-01 ENCOUNTER — Other Ambulatory Visit: Payer: Self-pay | Admitting: Internal Medicine

## 2019-04-10 ENCOUNTER — Ambulatory Visit (INDEPENDENT_AMBULATORY_CARE_PROVIDER_SITE_OTHER): Payer: PRIVATE HEALTH INSURANCE | Admitting: Internal Medicine

## 2019-04-10 ENCOUNTER — Encounter: Payer: Self-pay | Admitting: Internal Medicine

## 2019-04-10 ENCOUNTER — Other Ambulatory Visit: Payer: Self-pay

## 2019-04-10 DIAGNOSIS — E103299 Type 1 diabetes mellitus with mild nonproliferative diabetic retinopathy without macular edema, unspecified eye: Secondary | ICD-10-CM | POA: Diagnosis not present

## 2019-04-10 DIAGNOSIS — E785 Hyperlipidemia, unspecified: Secondary | ICD-10-CM

## 2019-04-10 MED ORDER — INSULIN LISPRO (1 UNIT DIAL) 100 UNIT/ML (KWIKPEN): 8.0000 [IU] | PEN_INJECTOR | Freq: Three times a day (TID) | SUBCUTANEOUS | 3 refills | Status: DC

## 2019-04-10 MED ORDER — TRESIBA FLEXTOUCH 200 UNIT/ML ~~LOC~~ SOPN: 42.0000 [IU] | PEN_INJECTOR | Freq: Every day | SUBCUTANEOUS | 4 refills | Status: DC

## 2019-04-10 NOTE — Patient Instructions (Addendum)
Please try to increase: - Tresiba to 40-42 units daily  Continue: - Humalog: 10 units before meals + sliding scale Target 120 ISF 30  This means: - 120-150: + 1 unit  - 151-180: + 2 units  - 181-210: + 3 units  - 210-240: + 4 units  - >240: + 5 units  Please come back for a follow-up appointment in 4 months.

## 2019-04-10 NOTE — Progress Notes (Signed)
Patient ID: Brian Braun, male   DOB: 1985/07/25, 33 y.o.   MRN: [id number]   Patient location: Home My location: Office Persons participating in the virtual visit: patient, provider  Referring Provider: self-referred  I connected with the patient on 04/10/19 at  7:43 AM EST by a video enabled telemedicine application and verified that I am speaking with the correct person.   I discussed the limitations of evaluation and management by telemedicine and the availability of in person appointments. The patient expressed understanding and agreed to proceed.   Details of the encounter are shown below. HPI: Brian Braun is a 33 y.o.-year-old male, returning for f/u for DM1, dx 1996, uncontrolled, with long-term complications (DR). Last visit 4 months ago.  Reviewed HbA1c levels: 01/2019: work: HbA1c 7.0% Lab Results  Component Value Date   HGBA1C 7.6 (A) 12/11/2018   HGBA1C 7.8 (A) 07/13/2018   HGBA1C 7.5 (A) 03/13/2018  01/2018: HbA1c (work): 6.8% (Novolog) 03/25/2016: HbA1c 6.2% 10/11/2013: 7.6% 06/13/2013: 7.3%  He was previously on the Medtronic MiniMed insulin pump up to 2011, but he developed a DKA episode is now not interested in restarting his pump.  He is now on: - Lantus >> Tresiba 38 units daily - Humalog: ICR 1:10 >> however, he is using 10 units before meals Target 120 ISF 30 Glucometer: Accu-Chek expert meter >> One Touch Verio He continues on Humalog, which is not as efficient for him as NovoLog, but he had a very high co-pay for NovoLog ($600) while for Humalog he only has to pay $25.  Pt checks his sugars 4 times a day: - am:  80-130, 220 >> 130-180 >> 100, 150-160 - 2h after b'fast: n/c - before lunch:   130-160 >> 150-160 >> 130-150 - 2h after lunch: n/c - 3-3:30: snack - before dinner: 120-170 >> 130-170 >> 85-180 - 2h after dinner: n/c - bedtime:  110-160 >> 130-170, 250 (eats PB + crackers if sugars >150) >> 150-160 - nighttime: before eating - 140-150s >>  n/c Lowest sugar was 70 >> 85 >> 85; he has hypoglycemia awareness in the 60s.  He has a nonexpired at home. Highest sugar was 250 >> 200 x1 (pm). He had one DKA admission in 2011.  Pt's meals are: - Breakfast: oatmeal or bacon and eggs + cheese biscuit - Lunch: sandwich or hamburger - Dinner: grilled chicken and rice; spaghetti; sloppy joes - Snacks:PB crackers at 10 am; cheeze it's   No history of CKD, last BUN/creatinine: Lab Results  Component Value Date   BUN 9 03/13/2018   CREATININE 1.22 03/13/2018   Lab Results  Component Value Date   GFR 54.44 (L) 03/25/2015   GFR 78.07 04/09/2014   No MAU Lab Results  Component Value Date   MICRALBCREAT 1.2 03/13/2018   MICRALBCREAT 1.3 03/18/2017   MICRALBCREAT 2.1 03/29/2016   MICRALBCREAT 0.7 03/25/2015   MICRALBCREAT 2.0 04/09/2014   He has a history of dyslipidemia; latest lipid panel: Lab Results  Component Value Date   CHOL 137 03/13/2018   HDL 44.30 03/13/2018   LDLCALC 79 03/13/2018   TRIG 67.0 03/13/2018   CHOLHDL 3 03/13/2018   Latest TSH was normal: Lab Results  Component Value Date   TSH 1.27 03/13/2018   - last eye exam was in 05/2015: + DR - has a new exam 04/2019. - he denies numbness and tingling in his feet.  ROS: Constitutional: no weight gain/no weight loss, no fatigue, no subjective hyperthermia, no subjective hypothermia Eyes: no  blurry vision, no xerophthalmia ENT: no sore throat, no nodules palpated in neck, no dysphagia, no odynophagia, no hoarseness Cardiovascular: no CP/no SOB/no palpitations/no leg swelling Respiratory: no cough/no SOB/no wheezing Gastrointestinal: no N/no V/no D/no C/no acid reflux Musculoskeletal: no muscle aches/no joint aches Skin: no rashes, no hair loss Neurological: no tremors/no numbness/no tingling/no dizziness  I reviewed pt's medications, allergies, PMH, social hx, family hx, and changes were documented in the history of present illness. Otherwise, unchanged  from my initial visit note.  Past Medical History:  Diagnosis Date  . Diabetes mellitus without complication ([place])    Type I    Past Surgical History:  Procedure Laterality Date  . NECK SURGERY  07/2011   History   Social History  . Marital Status:  married     Spouse Name: N/A    Number of Children: 1   Occupational History  .  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 Smoking status: Never Smoker   . Smokeless tobacco: Current User    Types: Chew  . Alcohol Use: 0.0 oz/week    0 Not specified per week  . Drug Use: No   Social History Narrative   Married   1 stepson   Current Outpatient Medications  Medication Sig Dispense Refill  . ACCU-CHEK SOFTCLIX LANCETS lancets USE TO TEST BLOOD SUGAR 4 TIMES A DAY 200 each 11  . glucagon (GLUCAGON EMERGENCY) 1 MG injection Inject 1 mg into the muscle once as needed. 2 each prn  . glucose blood (ONETOUCH VERIO) test strip Use to check blood sugar 4 times a day 400 each 12  . insulin lispro (HUMALOG KWIKPEN) 100 UNIT/ML KiwkPen INJECT 6-14 UNITS TOTAL INTO THE SKIN 3 (THREE) TIMES DAILY. 45 pen 0  . Insulin Pen Needle (CLICKFINE PEN NEEDLES) 32G X 4 MM MISC Use 4x a day 400 each 3  . TRESIBA FLEXTOUCH 200 UNIT/ML SOPN INJECT 38 UNITS INTO THE SKIN DAILY. 6 pen 8   No current facility-administered medications for this visit.   Allergies  Allergen Reactions  . Basaglar Kwikpen [Insulin Glargine] Other (See Comments)    Ineffective  . Humalog [Insulin Lispro] Other (See Comments)    Ineffective   Family history: None  PE: There were no vitals taken for this visit. There is no height or weight on file to calculate BMI. Wt Readings from Last 3 Encounters:  12/11/18 169 lb (76.7 kg)  07/13/18 149 lb (67.6 kg)  03/13/18 162 lb (73.5 kg)   Constitutional:  in NAD  The physical exam was not performed  (virtual visit).  ASSESSMENT: 1. DM1, uncontrolled, with complications (background DR)  - Per  records from Dr. Gabriel Carina, patient's diabetes was never very uncontrolled, his range is 7.2-7.6%. I suspect that he still has some insulin production from his pancreas.  -He is not interested in an insulin pump.  2. Dyslipidemia  PLAN:  1. Patient with longstanding, fairly well-controlled type 1 diabetes, with worse control in the past whenever his insurance forced him to switch insulins (from Lantus to [employer] and from NovoLog to Humalog).  He is currently on Tresiba, which works well for him. -At last visit, HbA1c was 7.6%.  We verified at that time how she was using the insulin to carb ratio and he was telling me that he used 10 units before his meals to  which he added a sliding scale.  Therefore, we changed from the regimen including insulin to carb ratios, took 6 doses of insulin per his preference.  During the summer, his sugars are usually higher since he is more active and wants to keep the CBGs above 150 so that they are not dropping when he is mowing lawns.  At last visit he was having a snack whenever his sugars were at goal and we discussed about skipping this. -At this visit, his sugars are still high in the morning although a little improved.  They are also improved before lunch, but still above goal, and quite variable before dinner.  If he has a more active day at work, sugars are lower before dinner.  If he has a snack, sugars may be higher, up to 180 before dinner.  As of now, he is taking between 8 and 14 units of Humalog before meals and I feel that this is accurate.  However, since the majority of blood sugars are higher than target, we discussed about increasing the Tresiba dose slightly. -We again discussed about a snack at night.  This is small and he has one if he is hungry, not because he is afraid that his sugars may drop. -He tells me he had labs for work and these included an  HbA1c, a lipid panel, and possibly had kidney function.  He will bring them with him at next visit. - I advised him to:  Patient Instructions  Please try to increase: - Tresiba to 40-42 units daily  Continue: - Humalog: 10 units before meals + sliding scale Target 120 ISF 30  This means: - 120-150: + 1 unit  - 151-180: + 2 units  - 181-210: + 3 units  - 210-240: + 4 units  - >240: + 5 units  Please come back for a follow-up appointment in 4 months.    - we will check his HbA1c when he returns to the clinic - advised to check sugars at different times of the day - 4x a day, rotating check times - advised for yearly eye exams >> he is not UTD, but has one scheduled - return to clinic in 4 months     2.  Dyslipidemia -Reviewed latest lipid panel from 02/2018: All fractions at goal: Lab Results  Component Value Date   CHOL 137 03/13/2018   HDL 44.30 03/13/2018   LDLCALC 79 03/13/2018   TRIG 67.0 03/13/2018   CHOLHDL 3 03/13/2018  -He is not on a statin -He is due for lipid panel-we will check this at next visit if not checked at work in 01/2019  Carlus Pavlovristina Irianna Gilday, tMD PhD [facility]

## 2019-04-16 ENCOUNTER — Telehealth: Payer: Self-pay

## 2019-04-16 NOTE — Telephone Encounter (Signed)
Can we give him a coupon for [place]?

## 2019-04-16 NOTE — Telephone Encounter (Signed)
Patient called to let us know he can not afford the [place]. He was getting 2 pens for 25.00 because the pharmacy was splitting the box but they are no longer doing that and the cost is 600.00.  Please advise.

## 2019-04-18 MED ORDER — LANTUS SOLOSTAR 100 UNIT/ML ~~LOC~~ SOPN: 42.0000 [IU] | PEN_INJECTOR | Freq: Every day | SUBCUTANEOUS | 1 refills | Status: DC

## 2019-04-18 NOTE — Telephone Encounter (Signed)
OK 

## 2019-04-18 NOTE — Telephone Encounter (Signed)
I would probably suggest to try Toujeo, since this can be taken once a day, while Lantus would be better to be taken twice a day.  Please tell him that Toujeo is just more concentrated Lantus.  However, he may need a slightly higher dose of Toujeo compared to the one that we were using for [place], by 2 to 4 units.

## 2019-04-18 NOTE — Telephone Encounter (Addendum)
Spoke to patient, he says he was only taking the Lantus once a day can he resume this?

## 2019-04-18 NOTE — Telephone Encounter (Signed)
RX for Lantus sent to pharmacy, patient notified.

## 2019-04-18 NOTE — Telephone Encounter (Signed)
Spoke to patient, he contacted his insurance company and the issue is that they no longer cover [place]. They now cover Toujeo and Lantus.  Patient states he was on Lantus before and would like to again. Ok to change RX?

## 2019-04-24 LAB — HM DIABETES EYE EXAM

## 2019-04-30 ENCOUNTER — Encounter: Payer: Self-pay | Admitting: Internal Medicine

## 2019-09-04 ENCOUNTER — Other Ambulatory Visit: Payer: Self-pay | Admitting: Internal Medicine

## 2019-09-05 ENCOUNTER — Other Ambulatory Visit: Payer: Self-pay | Admitting: Internal Medicine

## 2019-09-27 ENCOUNTER — Other Ambulatory Visit: Payer: Self-pay

## 2019-09-27 ENCOUNTER — Ambulatory Visit: Payer: PRIVATE HEALTH INSURANCE | Admitting: Internal Medicine

## 2019-09-27 ENCOUNTER — Encounter: Payer: Self-pay | Admitting: Internal Medicine

## 2019-09-27 VITALS — BP 120/90 | HR 100 | Ht 65.0 in | Wt 147.0 lb

## 2019-09-27 DIAGNOSIS — E108 Type 1 diabetes mellitus with unspecified complications: Secondary | ICD-10-CM | POA: Diagnosis not present

## 2019-09-27 DIAGNOSIS — E785 Hyperlipidemia, unspecified: Secondary | ICD-10-CM

## 2019-09-27 DIAGNOSIS — E103299 Type 1 diabetes mellitus with mild nonproliferative diabetic retinopathy without macular edema, unspecified eye: Secondary | ICD-10-CM

## 2019-09-27 LAB — POCT GLYCOSYLATED HEMOGLOBIN (HGB A1C): Hemoglobin A1C: 7.7 % — AB (ref 4.0–5.6)

## 2019-09-27 NOTE — Patient Instructions (Signed)
Please continue: - Lantus 35 units daily - Humalog: 10 units before meals + sliding scale Target 120 ISF 30  This means: - 120-150: + 1 unit  - 151-180: + 2 units  - 181-210: + 3 units  - 210-240: + 4 units  - >240: + 5 units  Please come back for a follow-up appointment in 4 months.

## 2019-09-27 NOTE — Progress Notes (Signed)
Patient ID: Brian Braun, male   DOB: [birth year], 34 y.o.   MRN: [id number]   This visit occurred during the SARS-CoV-2 public health emergency.  Safety protocols were in place, including screening questions prior to the visit, additional usage of staff PPE, and extensive cleaning of exam room while observing appropriate contact time as indicated for disinfecting solutions.   HPI: Brian Braun is a 33 y.o.-year-old male, returning for f/u for DM1, dx 1996, uncontrolled, with long-term complications (DR). Last visit 6 months ago (virtual).  Reviewed HbA1c levels: 01/2019: work: HbA1c 7.0% Lab Results  Component Value Date   HGBA1C 7.6 (A) 12/11/2018   HGBA1C 7.8 (A) 07/13/2018   HGBA1C 7.5 (A) 03/13/2018  01/2018: HbA1c (work): 6.8% (Novolog) 03/25/2016: HbA1c 6.2% 10/11/2013: 7.6% 06/13/2013: 7.3%  He was previously on the Medtronic MiniMed insulin pump up to 2011, but he developed DKA episodes and now he is not interested in restarting a pump.  He is now on: -Lantus 40-42 >> 35 units daily (takes a lower dose, as he could not remember the recommended dose...) - Humalog: 10 units before meals + sliding scale (with 7-10 units per meal) Target 120 ISF 30 This means: - 120-150: + 1 unit  - 151-180: + 2 units  - 181-210: + 3 units  - 210-240: + 4 units  - >240: + 5 units  Glucometer: Accu-Chek expert meter >> One Touch Verio He continues on Humalog, which is not as efficient for him as NovoLog, but he had a very high co-pay for NovoLog ($600) while for Humalog he only has to pay $25.  Pt checks his sugars 4 times a day: - am:  80-130, 220 >> 130-180 >> 100, 150-160 >> 120s - 2h after b'fast: n/c - before lunch:   130-160 >> 150-160 >> 130-150 >> 160-170 (snack) - 2h after lunch: n/c - 3-3:30: snack  - before dinner: 120-170 >> 130-170 >> 85-180 >> 160-200 - 2h after dinner: n/c - bedtime:   130-170, 250 (eats PB + crackers if sugars >150) >> 150-160 >> 150s, 240 - nighttime:  before eating - 140-150s >> n/c Lowest sugar was 70 >> 85 >> 85 >> 85;  he has hypoglycemia awareness in the 60s.  He has an unexpired glucagon pen at home. Highest sugar was 250 >> 200 x1 (pm) >> 240. He had one DKA admission in 2011.  Pt's meals are: - Breakfast: oatmeal or bacon and eggs + cheese biscuit - Lunch: sandwich or hamburger - Dinner: grilled chicken and rice; spaghetti; sloppy joes - Snacks:PB crackers at 10 am; cheeze it's   No history of CKD, last BUN/creatinine: Lab Results  Component Value Date   BUN 9 03/13/2018   CREATININE 1.22 03/13/2018   Lab Results  Component Value Date   GFR 54.44 (L) 03/25/2015   GFR 78.07 04/09/2014   No MAU Lab Results  Component Value Date   MICRALBCREAT 1.2 03/13/2018   MICRALBCREAT 1.3 03/18/2017   MICRALBCREAT 2.1 03/29/2016   MICRALBCREAT 0.7 03/25/2015   MICRALBCREAT 2.0 04/09/2014   He has a history of dyslipidemia but latest lipid panel was normal: Lab Results  Component Value Date   CHOL 137 03/13/2018   HDL 44.30 03/13/2018   LDLCALC 79 03/13/2018   TRIG 67.0 03/13/2018   CHOLHDL 3 03/13/2018   Last TSH was normal: Lab Results  Component Value Date   TSH 1.27 03/13/2018   - last eye exam was in 04/2019: + DR - no  numbness and  tingling in his feet.  ROS: Constitutional: no weight gain/+ weight loss, no fatigue, no subjective hyperthermia, no subjective hypothermia Eyes: no blurry vision, no xerophthalmia ENT: no sore throat, no nodules palpated in neck, no dysphagia, no odynophagia, no hoarseness Cardiovascular: no CP/no SOB/no palpitations/no leg swelling Respiratory: no cough/no SOB/no wheezing Gastrointestinal: no N/no V/no D/no C/no acid reflux Musculoskeletal: no muscle aches/no joint aches Skin: no rashes, no hair loss Neurological: no tremors/no numbness/no tingling/no dizziness  I reviewed pt's medications, allergies, PMH, social hx, family hx, and changes were documented in the history of  present illness. Otherwise, unchanged from my initial visit note.  Past Medical History:  Diagnosis Date  . Diabetes mellitus without complication (HCC)    Type I    Past Surgical History:  Procedure Laterality Date  . NECK SURGERY  07/2011   History   Social History  . Marital Status:  married     Spouse Name: N/A    Number of Children: 1   Occupational History  .  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 Smoking status: Never Smoker   . Smokeless tobacco: Current User    Types: Chew  . Alcohol Use: 0.0 oz/week    0 Not specified per week  . Drug Use: No   Social History Narrative   Married   1 stepson   Current Outpatient Medications  Medication Sig Dispense Refill  . ACCU-CHEK SOFTCLIX LANCETS lancets USE TO TEST BLOOD SUGAR 4 TIMES A DAY 200 each 11  . glucagon (GLUCAGON EMERGENCY) 1 MG injection Inject 1 mg into the muscle once as needed. 2 each prn  . insulin lispro (HUMALOG KWIKPEN) 100 UNIT/ML KwikPen Inject 0.08-0.15 mLs (8-15 Units total) into the skin 3 (three) times daily. 30 mL 3  . Insulin Pen Needle (CLICKFINE PEN NEEDLES) 32G X 4 MM MISC Use 4x a day 400 each 3  . LANTUS SOLOSTAR 100 UNIT/ML Solostar Pen INJECT 42 UNITS INTO THE SKIN DAILY. 15 mL 1  . ONETOUCH VERIO test strip USE TO CHECK BLOOD SUGAR 4 TIMES A DAY 400 strip 12   No current facility-administered medications for this visit.   Allergies  Allergen Reactions  . Basaglar Kwikpen [Insulin Glargine] Other (See Comments)    Ineffective  . Humalog [Insulin Lispro] Other (See Comments)    Ineffective   Family history: None  PE: BP 120/90   Pulse 100   Ht 5\' 5"  (1.651 m)   Wt 147 lb (66.7 kg)   SpO2 96%   BMI 24.46 kg/m  Body mass index is 24.46 kg/m. Wt Readings from Last 3 Encounters:  09/27/19 147 lb (66.7 kg)  12/11/18 169 lb (76.7 kg)  07/13/18 149  lb (67.6 kg)   Constitutional: Normal weight, in NAD Eyes: PERRLA, EOMI, no exophthalmos ENT: moist mucous membranes, no thyromegaly, no cervical lymphadenopathy Cardiovascular: tachycardia, RR, No MRG Respiratory: CTA B Gastrointestinal: abdomen soft, NT, ND, BS+ Musculoskeletal: no deformities, strength intact in all 4 Skin: moist, warm, no rashes Neurological: no tremor with outstretched hands, DTR normal in all 4  ASSESSMENT: 1. DM1, uncontrolled, with complications (background DR)  - Per  records from Dr. 07/15/18, patient's diabetes was never very uncontrolled, his range is 7.2-7.6%. I suspect that he still has some insulin production from his pancreas.   -He is not interested in an insulin  pump.  2. Dyslipidemia  PLAN:  1. Patient with longstanding, fairly well-controlled type 1 diabetes, with worse control in the past, whenever his insurance forced him to switch insulins (from Lantus to [employer] and from NovoLog to Humalog).  At last visit, he was on Tresiba, which worked well for him, but we had to switch to another insulin since this started to be covered.  I suggested Toujeo, since this was more concentrated and longer lasting than Basaglar, but he was not able to start this and is now on Lantus.   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  Also, he is having frequent snacks even when sugars were at goal so we discussed about trying to stop these.  At last visit, sugars were high in the morning although a little better.  They were also better before lunch but they were quite variable before dinner.  I again advised him to try to stop snacking. -At this visit, he is actually taking a lower dose of Lantus than recommended Monday sugars are at goal in the morning so for now we will continue the same dose.  He takes less insulin for meals on purpose, though he does not  drop his sugars during mowing or if he is more active at work.  However, I feel that he is taking a much lower dose than needed and he actually lost a significant amount of weight and he feels he is getting more dehydrated.  I strongly advised him not to reduce the dose of Humalog as much as he is doing now but to take a minimum of 10 units per meal.  He may avoid the sliding scale if he is more active, not decrease the dose further. - I advised him to:  Patient Instructions  Please continue: -Toujeo 40-42 units daily - Humalog: 10 units before meals + sliding scale Target 120 ISF 30  This means: - 120-150: + 1 unit  - 151-180: + 2 units  - 181-210: + 3 units  - 210-240: + 4 units  - >240: + 5 units  Please come back for a follow-up appointment in 4 months.    - we checked his HbA1c: 7.7% (higher) - advised to check sugars at different times of the day - 4x a day, rotating check times - advised for yearly eye exams >> he is UTD - will check annual labs today - return to clinic in 4 months    2.  Dyslipidemia -Reviewed latest lipid panel from 02/2018: All fractions at goal: Lab Results  Component Value Date   CHOL 137 03/13/2018   HDL 44.30 03/13/2018   LDLCALC 79 03/13/2018   TRIG 67.0 03/13/2018   CHOLHDL 3 03/13/2018  -He is not on a statin -He is due for another lipid panel-we will check this today  Component     Latest Ref Rng & Units 09/27/2019  Sodium     135 - 145 mEq/L 137  Potassium     3.5 - 5.1 mEq/L 4.5  Chloride     96 - 112 mEq/L 101  CO2     19 - 32 mEq/L 31  Glucose     70 - 99 mg/dL 122 (H)  BUN     6 - 23 mg/dL 7  Creatinine     0.40 - 1.50 mg/dL 1.36  Total Bilirubin  0.2 - 1.2 mg/dL 0.8  Alkaline Phosphatase     39 - 117 U/L 79  AST     0 - 37 U/L 15  ALT     0 - 53 U/L 12  Total Protein     6.0 - 8.3 g/dL 7.3  Albumin     3.5 - 5.2 g/dL 4.4  Calcium     8.4 - 10.5 mg/dL 9.6  GFR     >50.27 mL/min 59.65 (L)  Cholesterol     0  - 200 mg/dL 741  Triglycerides     0 - 149 mg/dL 28.7  HDL Cholesterol     >39.00 mg/dL 86.76  VLDL     0.0 - 72.0 mg/dL 94.7  LDL (calc)     0 - 99 mg/dL 64  Total CHOL/HDL Ratio      3  NonHDL      81.39  Microalb, Ur     0.0 - 1.9 mg/dL 1.4  Creatinine,U     mg/dL 096.2  MICROALB/CREAT RATIO     0.0 - 30.0 mg/g 1.0  TSH     0.35 - 4.50 uIU/mL 1.04   Labs are at goal with exception of a slightly low GFR.  Improving his sugars as discussed will help.  Carlus Pavlov, tMD PhD [facility] Endocrinology

## 2019-09-28 LAB — COMPREHENSIVE METABOLIC PANEL
ALT: 12 U/L (ref 0–53)
AST: 15 U/L (ref 0–37)
Albumin: 4.4 g/dL (ref 3.5–5.2)
Alkaline Phosphatase: 79 U/L (ref 39–117)
BUN: 7 mg/dL (ref 6–23)
CO2: 31 mEq/L (ref 19–32)
Calcium: 9.6 mg/dL (ref 8.4–10.5)
Chloride: 101 mEq/L (ref 96–112)
Creatinine, Ser: 1.36 mg/dL (ref 0.40–1.50)
GFR: 59.65 mL/min — ABNORMAL LOW (ref >60.00)
Glucose, Bld: 122 mg/dL — ABNORMAL HIGH (ref 70–99)
Potassium: 4.5 mEq/L (ref 3.5–5.1)
Sodium: 137 mEq/L (ref 135–145)
Total Bilirubin: 0.8 mg/dL (ref 0.2–1.2)
Total Protein: 7.3 g/dL (ref 6.0–8.3)

## 2019-09-28 LAB — LIPID PANEL
Cholesterol: 122 mg/dL (ref 0–200)
HDL: 40.6 mg/dL (ref >39.00)
LDL Cholesterol: 64 mg/dL (ref 0–99)
NonHDL: 81.39
Total CHOL/HDL Ratio: 3
Triglycerides: 89 mg/dL (ref 0.0–149.0)
VLDL: 17.8 mg/dL (ref 0.0–40.0)

## 2019-09-28 LAB — MICROALBUMIN / CREATININE URINE RATIO
Creatinine,U: 144.6 mg/dL
Microalb Creat Ratio: 1 mg/g (ref 0.0–30.0)
Microalb, Ur: 1.4 mg/dL (ref 0.0–1.9)

## 2019-09-28 LAB — TSH: TSH: 1.04 u[IU]/mL (ref 0.35–4.50)

## 2019-10-10 ENCOUNTER — Other Ambulatory Visit: Payer: Self-pay

## 2019-10-10 ENCOUNTER — Encounter: Payer: Self-pay | Admitting: Family Medicine

## 2019-10-10 ENCOUNTER — Ambulatory Visit: Payer: Self-pay | Admitting: Family Medicine

## 2019-10-10 DIAGNOSIS — Z024 Encounter for examination for driving license: Secondary | ICD-10-CM

## 2019-10-10 LAB — POCT URINALYSIS DIPSTICK
Bilirubin, UA: NEGATIVE
Blood, UA: NEGATIVE
Glucose, UA: POSITIVE — AB
Ketones, UA: NEGATIVE
Leukocytes, UA: NEGATIVE
Nitrite, UA: NEGATIVE
Protein, UA: NEGATIVE
Spec Grav, UA: 1.015 (ref 1.010–1.025)
Urobilinogen, UA: 0.2 E.U./dL
pH, UA: 5 (ref 5.0–8.0)

## 2019-10-10 NOTE — Patient Instructions (Addendum)
DOT PE certificate provided x 1 year  Diabetic [id number] form completed endocrinologist

## 2019-10-10 NOTE — Addendum Note (Signed)
Addended by: Lonna Cobb on: 10/10/2019 10:46 AM   Modules accepted: Orders

## 2019-10-10 NOTE — Progress Notes (Signed)
Subjective:    Patient ID: Brian Braun, male    DOB: Nov 04, 1985, 34 y.o.   MRN: [id number]  Brian Braun is a 34 y.o. male presenting on 10/10/2019 for Employment Physical   HPI  Brian Braun presents to clinic for a DOT PE.  No flowsheet data found.  Social History   Tobacco Use  . Smoking status: Never Smoker  . Smokeless tobacco: Current User    Types: Chew  Vaping Use  . Vaping Use: Never used  Substance Use Topics  . Alcohol use: Yes    Alcohol/week: 0.0 standard drinks  . Drug use: No    Review of Systems  Constitutional: Negative.   HENT: Negative.   Eyes: Negative.   Respiratory: Negative.   Cardiovascular: Negative.   Gastrointestinal: Negative.   Endocrine: Negative.   Genitourinary: Negative.   Musculoskeletal: Negative.   Skin: Negative.   Allergic/Immunologic: Negative.   Neurological: Negative.   Hematological: Negative.   Psychiatric/Behavioral: Negative.    Per HPI unless specifically indicated above     Objective:    BP 117/79 (BP Location: Left Arm, Patient Position: Sitting, Cuff Size: Normal)   Pulse 78   Temp (!) 97.3 F (36.3 C) (Temporal)   Ht 5' 6.25" (1.683 m)   Wt 152 lb 6.4 oz (69.1 kg)   BMI 24.41 kg/m   Wt Readings from Last 3 Encounters:  10/10/19 152 lb 6.4 oz (69.1 kg)  09/27/19 147 lb (66.7 kg)  12/11/18 169 lb (76.7 kg)    Physical Exam Vitals reviewed.  Constitutional:      General: He is not in acute distress.    Appearance: Normal appearance. He is well-developed and well-groomed. He is obese. He is not ill-appearing or toxic-appearing.  HENT:     Head: Normocephalic.     Right Ear: Tympanic membrane, ear canal and external ear normal. There is no impacted cerumen.     Left Ear: Tympanic membrane, ear canal and external ear normal. There is no impacted cerumen.     Nose: Nose normal. No congestion or rhinorrhea.     Mouth/Throat:     Mouth: Mucous membranes are moist.     Pharynx: Oropharynx is  clear. No oropharyngeal exudate or posterior oropharyngeal erythema.     Tonsils: 2+ on the right. 2+ on the left.  Eyes:     General: Lids are normal. Vision grossly intact. No scleral icterus.       Right eye: No discharge.        Left eye: No discharge.     Extraocular Movements: Extraocular movements intact.     Conjunctiva/sclera: Conjunctivae normal.     Pupils: Pupils are equal, round, and reactive to light.  Cardiovascular:     Rate and Rhythm: Normal rate and regular rhythm.     Pulses: Normal pulses.          Dorsalis pedis pulses are 2+ on the right side and 2+ on the left side.     Heart sounds: Normal heart sounds. No murmur heard.  No friction rub. No gallop.   Pulmonary:     Effort: Pulmonary effort is normal. No respiratory distress.     Breath sounds: Normal breath sounds. No wheezing, rhonchi or rales.  Abdominal:     General: Abdomen is flat. Bowel sounds are normal. There is no distension.     Palpations: Abdomen is soft. There is no hepatomegaly, splenomegaly or mass.     Tenderness: There is  no abdominal tenderness. There is no right CVA tenderness, left CVA tenderness, guarding or rebound.     Hernia: No hernia is present.  Musculoskeletal:        General: Normal range of motion.     Cervical back: Normal range of motion and neck supple. No rigidity or tenderness.     Right lower leg: No edema.     Left lower leg: No edema.     Comments: Normal tone, 5/5 strength BUE & BLE  Feet:     Right foot:     Skin integrity: Skin integrity normal.     Left foot:     Skin integrity: Skin integrity normal.  Lymphadenopathy:     Cervical: No cervical adenopathy.  Skin:    General: Skin is warm and dry.     Capillary Refill: Capillary refill takes less than 2 seconds.  Neurological:     General: No focal deficit present.     Mental Status: He is alert and oriented to person, place, and time.     Cranial Nerves: Cranial nerves are intact. No cranial nerve deficit.      Sensory: Sensation is intact. No sensory deficit.     Motor: Motor function is intact. No weakness.     Coordination: Coordination is intact. Coordination normal.     Gait: Gait is intact. Gait normal.     Deep Tendon Reflexes: Reflexes are normal and symmetric. Reflexes normal.  Psychiatric:        Attention and Perception: Attention and perception normal.        Mood and Affect: Mood and affect normal.        Speech: Speech normal.        Behavior: Behavior normal. Behavior is cooperative.        Thought Content: Thought content normal.        Cognition and Memory: Cognition and memory normal.        Judgment: Judgment normal.     Results for orders placed or performed in visit on 09/27/19  TSH  Result Value Ref Range   TSH 1.04 0.35 - 4.50 uIU/mL  Microalbumin / creatinine urine ratio  Result Value Ref Range   Microalb, Ur 1.4 0.0 - 1.9 mg/dL   Creatinine,U 109.3 mg/dL   Microalb Creat Ratio 1.0 0.0 - 30.0 mg/g  Comprehensive metabolic panel  Result Value Ref Range   Sodium 137 135 - 145 mEq/L   Potassium 4.5 3.5 - 5.1 mEq/L   Chloride 101 96 - 112 mEq/L   CO2 31 19 - 32 mEq/L   Glucose, Bld 122 (H) 70 - 99 mg/dL   BUN 7 6 - 23 mg/dL   Creatinine, Ser 2.35 0.40 - 1.50 mg/dL   Total Bilirubin 0.8 0.2 - 1.2 mg/dL   Alkaline Phosphatase 79 39 - 117 U/L   AST 15 0 - 37 U/L   ALT 12 0 - 53 U/L   Total Protein 7.3 6.0 - 8.3 g/dL   Albumin 4.4 3.5 - 5.2 g/dL   GFR 57.32 (L) >20.25 mL/min   Calcium 9.6 8.4 - 10.5 mg/dL  Lipid panel  Result Value Ref Range   Cholesterol 122 0 - 200 mg/dL   Triglycerides 42.7 0 - 149 mg/dL   HDL 06.23 >76.28 mg/dL   VLDL 31.5 0.0 - 17.6 mg/dL   LDL Cholesterol 64 0 - 99 mg/dL   Total CHOL/HDL Ratio 3    NonHDL 81.39   POCT glycosylated hemoglobin (Hb  A1C)  Result Value Ref Range   Hemoglobin A1C 7.7 (A) 4.0 - 5.6 %   HbA1c POC (<> result, manual entry)     HbA1c, POC (prediabetic range)     HbA1c, POC (controlled diabetic range)          Assessment & Plan:   Problem List Items Addressed This Visit      Other   Encounter for commercial driving license (CDL) exam    DOT PE Certificate pending.  Awaiting Insulin Waiver form completed by PCP/Endocrinologist         No orders of the defined types were placed in this encounter.     Follow up plan: Return if symptoms worsen or fail to improve.   Charlaine Dalton, FNP Family Nurse Practitioner [facility] 10/10/2019, 10:29 AM

## 2019-10-10 NOTE — Assessment & Plan Note (Addendum)
DOT PE Certificate provided x 1 year.  Received Insulin Waiver form completed by PCP/Endocrinologist

## 2019-10-12 ENCOUNTER — Telehealth: Payer: Self-pay

## 2019-10-12 NOTE — Telephone Encounter (Signed)
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 No answer, vm full.   I called the patient (girlfriend) Brian Braun, no answer left a message on her voicemail to have Brian Braun to give [place] a call concerning his paperwork.

## 2019-10-12 NOTE — Telephone Encounter (Signed)
Patient called back. Advised that his paperwork was available for pick up. Patietn states that he will come today before 5p.

## 2019-10-12 NOTE — Telephone Encounter (Signed)
Forms for DOT filled out, signed by Dr. Elvera Lennox and faxed to Danielle Rankin at [facility] with confirmation.

## 2019-12-16 ENCOUNTER — Encounter: Payer: Self-pay | Admitting: Internal Medicine

## 2019-12-27 ENCOUNTER — Ambulatory Visit: Payer: PRIVATE HEALTH INSURANCE | Admitting: Internal Medicine

## 2020-01-29 ENCOUNTER — Encounter: Payer: Self-pay | Admitting: Internal Medicine

## 2020-01-29 ENCOUNTER — Ambulatory Visit (INDEPENDENT_AMBULATORY_CARE_PROVIDER_SITE_OTHER): Payer: BC Managed Care – PPO | Admitting: Internal Medicine

## 2020-01-29 ENCOUNTER — Other Ambulatory Visit: Payer: Self-pay

## 2020-01-29 VITALS — BP 140/92 | HR 79 | Ht 66.0 in | Wt 147.4 lb

## 2020-01-29 DIAGNOSIS — E785 Hyperlipidemia, unspecified: Secondary | ICD-10-CM

## 2020-01-29 DIAGNOSIS — E103299 Type 1 diabetes mellitus with mild nonproliferative diabetic retinopathy without macular edema, unspecified eye: Secondary | ICD-10-CM | POA: Diagnosis not present

## 2020-01-29 LAB — POCT GLYCOSYLATED HEMOGLOBIN (HGB A1C): Hemoglobin A1C: 7.6 % — AB (ref 4.0–5.6)

## 2020-01-29 MED ORDER — FIASP FLEXTOUCH 100 UNIT/ML ~~LOC~~ SOPN: PEN_INJECTOR | SUBCUTANEOUS | 3 refills | Status: DC

## 2020-01-29 MED ORDER — LANTUS SOLOSTAR 100 UNIT/ML ~~LOC~~ SOPN: 42.0000 [IU] | PEN_INJECTOR | Freq: Every day | SUBCUTANEOUS | 3 refills | Status: DC

## 2020-01-29 MED ORDER — INSULIN LISPRO (1 UNIT DIAL) 100 UNIT/ML (KWIKPEN): 8.0000 [IU] | PEN_INJECTOR | Freq: Three times a day (TID) | SUBCUTANEOUS | 3 refills | Status: DC

## 2020-01-29 MED ORDER — GLUCAGON 3 MG/DOSE NA POWD: 3.0000 mg | Freq: Once | NASAL | 11 refills | Status: DC | PRN

## 2020-01-29 NOTE — Patient Instructions (Signed)
Please continue: - Lantus 40-42 units daily  Change from Humalog to: - FiAsp (inject at the start of the meal): 8-14 units before meals + sliding scale Target 120 ISF 30 This means: - 120-150: + 1 unit  - 151-180: + 2 units  - 181-210: + 3 units  - 210-240: + 4 units  - >240: + 5 units  Please come back for a follow-up appointment in 4 months.

## 2020-01-29 NOTE — Progress Notes (Signed)
Patient ID: Brian Braun, male   DOB: 03-06-1986, 34 y.o.   MRN: [id number]   This visit occurred during the SARS-CoV-2 public health emergency.  Safety protocols were in place, including screening questions prior to the visit, additional usage of staff PPE, and extensive cleaning of exam room while observing appropriate contact time as indicated for disinfecting solutions.   HPI: Brian Braun is a 34 y.o.-year-old male, returning for f/u for DM1, dx 1996, uncontrolled, with long-term complications (DR). Last visit 4 months ago.   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  He has a new insurance starting this month: [employer].  Reviewed HbA1c levels: Lab Results  Component Value Date   HGBA1C 7.7 (A) 09/27/2019   HGBA1C 7.6 (A) 12/11/2018   HGBA1C 7.8 (A) 07/13/2018  01/2019: work: HbA1c 7.0% 01/2018: HbA1c (work): 6.8% (Novolog) 03/25/2016: HbA1c 6.2% 10/11/2013: 7.6% 06/13/2013: 7.3%  He was previously on the Medtronic MiniMed insulin pump up to 2011, but he developed DKA episodes and now he is not interested in restarting the pump.  He is on: - Lantus 40-42 >> 35 >> 40-42 units daily  - Humalog: 10 units before meals + sliding scale -usually takes 8 to 10 units per meal Target 120 ISF 30 This means: - 120-150: + 1 unit  - 151-180: + 2 units  - 181-210: + 3 units  - 210-240: + 4 units  - >240: + 5 units  Glucometer: Accu-Chek expert meter >> One Touch Verio He continues on Humalog, which is not as efficient for him as NovoLog, but he had a very high co-pay for NovoLog ($600) while for Humalog he only has to pay $25.  Pt checks his sugars 4 times a day: - am:  80-130, 220 >> 130-180 >> 100, 150-160 >> 120s >> 120-130 - 2h after b'fast: n/c - before lunch:  150-160 >> 130-150 >> 160-170 (snack) >> 170-200 (may have a snack before lunch) - 2h after lunch: n/c - 3-3:30: snack  - before dinner: 120-170 >> 130-170 >> 85-180 >> 160-200 >>  120-160 - 2h after dinner: n/c - bedtime:   130-170, 250 (eats PB + crackers if sugars >150) >> 150-160 >> 150s, 240 >> 110-140 - nighttime: before eating - 140-150s >> n/c Lowest sugar was 70 >> .Marland Kitchen..85 >> 85;  he has hypoglycemia awareness in the 60s.  He has a nonexpired glucagon kit at home. Highest sugar was 250 >> 200 x1 (pm) >> 240 >> 300. He had one DKA admission in 2011.  Pt's meals are: - Breakfast: oatmeal or bacon and eggs + cheese biscuit - Lunch: sandwich or hamburger - Dinner: grilled chicken and rice; spaghetti; sloppy joes - Snacks:PB crackers at 10 am; cheeze it's   No history of CKD, last BUN/creatinine: Lab Results  Component Value Date   BUN 7 09/27/2019   CREATININE 1.36 09/27/2019   Lab Results  Component Value Date   GFR 59.65 (L) 09/27/2019   GFR 54.44 (L) 03/25/2015   GFR 78.07 04/09/2014   No MAU: Lab Results  Component Value Date   MICRALBCREAT 1.0 09/27/2019   MICRALBCREAT 1.2 03/13/2018   MICRALBCREAT 1.3 03/18/2017   MICRALBCREAT 2.1 03/29/2016   MICRALBCREAT 0.7 03/25/2015   He has a history of dyslipidemia, but latest lipid panel was normal: Lab Results  Component Value Date   CHOL 122 09/27/2019   HDL 40.60 09/27/2019   LDLCALC 64 09/27/2019  TRIG 89.0 09/27/2019   CHOLHDL 3 09/27/2019   Latest TSH was normal: Lab Results  Component Value Date   TSH 1.04 09/27/2019   - last eye exam was in 04/2019: + DR -No numbness and tingling in his feet.  ROS: Constitutional: no weight gain/no weight loss, no fatigue, no subjective hyperthermia, no subjective hypothermia Eyes: no blurry vision, no xerophthalmia ENT: no sore throat, no nodules palpated in neck, no dysphagia, no odynophagia, no hoarseness Cardiovascular: no CP/no SOB/no palpitations/no leg swelling Respiratory: no cough/no SOB/no wheezing Gastrointestinal: no N/no V/no D/no C/no acid reflux Musculoskeletal: no muscle aches/no joint aches Skin: no rashes, no hair  loss Neurological: no tremors/no numbness/no tingling/no dizziness  I reviewed pt's medications, allergies, PMH, social hx, family hx, and changes were documented in the history of present illness. Otherwise, unchanged from my initial visit note.  Past Medical History:  Diagnosis Date  . Diabetes mellitus without complication ([place])    Type I    Past Surgical History:  Procedure Laterality Date  . NECK SURGERY  07/2011   History   Social History  . Marital Status:  married     Spouse Name: N/A    Number of Children: 1   Occupational History  .  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 Smoking status: Never Smoker   . Smokeless tobacco: Current User    Types: Chew  . Alcohol Use: 0.0 oz/week    0 Not specified per week  . Drug Use: No   Social History Narrative   Married   1 stepson   Current Outpatient Medications  Medication Sig Dispense Refill  . ACCU-CHEK SOFTCLIX LANCETS lancets USE TO TEST BLOOD SUGAR 4 TIMES A DAY 200 each 11  . glucagon (GLUCAGON EMERGENCY) 1 MG injection Inject 1 mg into the muscle once as needed. 2 each prn  . insulin lispro (HUMALOG KWIKPEN) 100 UNIT/ML KwikPen Inject 0.08-0.15 mLs (8-15 Units total) into the skin 3 (three) times daily. 30 mL 3  . Insulin Pen Needle (CLICKFINE PEN NEEDLES) 32G X 4 MM MISC Use 4x a day 400 each 3  . LANTUS SOLOSTAR 100 UNIT/ML Solostar Pen INJECT 42 UNITS INTO THE SKIN DAILY. 15 mL 1  . ONETOUCH VERIO test strip USE TO CHECK BLOOD SUGAR 4 TIMES A DAY 400 strip 12   No current facility-administered medications for this visit.   Allergies  Allergen Reactions  . Basaglar Kwikpen [Insulin Glargine] Other (See Comments)    Ineffective  . Humalog [Insulin Lispro] Other (See Comments)    Ineffective   Family history: None  PE: BP (!) 140/92   Pulse 79   Ht $R'5\' 6"'Yp$  (1.676 m)   Wt 147  lb 6.4 oz (66.9 kg)   SpO2 97%   BMI 23.79 kg/m  Body mass index is 23.79 kg/m. Wt Readings from Last 3 Encounters:  01/29/20 147 lb 6.4 oz (66.9 kg)  10/10/19 152 lb 6.4 oz (69.1 kg)  09/27/19 147 lb (66.7 kg)   Constitutional: normal weight, in NAD Eyes: PERRLA, EOMI, no exophthalmos ENT: moist mucous membranes, no thyromegaly, no cervical lymphadenopathy Cardiovascular: RRR, No MRG Respiratory: CTA B Gastrointestinal: abdomen soft, NT, ND, BS+ Musculoskeletal: no deformities, strength intact in all 4 Skin: moist, warm, no rashes Neurological: no tremor with outstretched hands, DTR normal in all 4  ASSESSMENT: 1.  DM1, uncontrolled, with complications (background DR)  - Per  records from Dr. Gabriel Carina, patient's diabetes was never very uncontrolled, his range is 7.2-7.6%. I suspect that he still has some insulin production from his pancreas.   -He is not interested in an insulin pump.  2. Dyslipidemia  PLAN:  1. Patient with longstanding, fairly well-controlled type 1 diabetes, with worse control in the past, whenever his insurance forced him to switch insulins from Lantus to [place] and from NovoLog to Humalog.  We had him on Tresiba in the past which worked well for him but we had to switch to another insulin since this was not covered anymore.  However, he could only obtain Lantus.   - During the summer, he likes to keep his sugars higher, since he is more active mowing lawns.  Also, he is having frequent snacks even when sugars are at goal so we discussed about trying to stop these.  At last visit, she was also taking a lower dose of Lantus that recommended if he forgot the recommended dose.  As his sugars were at goal in the morning, we did not change the Lantus dose.  Sugars were however higher in the rest of the day and we discussed about keeping his Humalog dose to a minimum of 10 units per meal.  We discussed that he can try to hold a sliding scale when he was more active to  prevent low blood sugars, but I did not recommend to take lower Humalog doses. -At today's visit, he feels that his sugars are higher after he changed his job, since this is more sedentary. He is trying to take a little bit more Humalog, but is still not using the 10 units recommended at last visit. He is mostly rotating between 8 and 9 units, only occasionally 10 units. He mentions that he is not convinced that he needs a higher dose and will start using this going forward. I have recommended to go up to 14 units if needed. Says he is having problems injecting the insulin 15 minutes before meals, I suggested to change to a more rapid onset insulin, like Fiasp. It appears that after he changed his insurance Humalog is not covered anymore. - I advised him to:  Patient Instructions  Please continue: - Lantus 40-42 units daily  Change from Humalog to: - FiAsp (inject at the start of the meal): 8-14 units before meals + sliding scale Target 120 ISF 30 This means: - 120-150: + 1 unit  - 151-180: + 2 units  - 181-210: + 3 units  - 210-240: + 4 units  - >240: + 5 units  Please come back for a follow-up appointment in 4 months.    - we checked his HbA1c: 7.6% (only slightly lower) - advised to check sugars at different times of the day - 4x a day, rotating check times - advised for yearly eye exams >> he is UTD - refuses a flu shot now, but will get it later at work - return to clinic in 4 months   2.  Dyslipidemia -Reviewed latest lipid panel from last visit: All fractions at goal Lab Results  Component Value Date   CHOL 122 09/27/2019   HDL 40.60 09/27/2019   LDLCALC 64 09/27/2019   TRIG 89.0 09/27/2019   CHOLHDL 3 09/27/2019  -He is not on a statin  Philemon Kingdom, MD PhD [facility] Endocrinology

## 2020-02-03 ENCOUNTER — Encounter: Payer: Self-pay | Admitting: Internal Medicine

## 2020-04-30 ENCOUNTER — Encounter: Payer: Self-pay | Admitting: Internal Medicine

## 2020-05-27 ENCOUNTER — Encounter: Payer: Self-pay | Admitting: Internal Medicine

## 2020-06-05 ENCOUNTER — Ambulatory Visit: Payer: BC Managed Care – PPO | Admitting: Internal Medicine

## 2020-06-05 ENCOUNTER — Other Ambulatory Visit: Payer: Self-pay

## 2020-06-05 ENCOUNTER — Other Ambulatory Visit: Payer: Self-pay | Admitting: Internal Medicine

## 2020-06-05 ENCOUNTER — Encounter: Payer: Self-pay | Admitting: Internal Medicine

## 2020-06-05 VITALS — BP 130/90 | HR 85 | Ht 66.0 in | Wt 151.0 lb

## 2020-06-05 DIAGNOSIS — E785 Hyperlipidemia, unspecified: Secondary | ICD-10-CM | POA: Diagnosis not present

## 2020-06-05 DIAGNOSIS — E103299 Type 1 diabetes mellitus with mild nonproliferative diabetic retinopathy without macular edema, unspecified eye: Secondary | ICD-10-CM

## 2020-06-05 LAB — POCT GLYCOSYLATED HEMOGLOBIN (HGB A1C): Hemoglobin A1C: 8.5 % — AB (ref 4.0–5.6)

## 2020-06-05 MED ORDER — LYUMJEV KWIKPEN 100 UNIT/ML ~~LOC~~ SOPN: PEN_INJECTOR | SUBCUTANEOUS | 3 refills | Status: DC

## 2020-06-05 NOTE — Telephone Encounter (Signed)
Not covered by insurance, Covered alternatives Novolin (N) or (R) Humulin R U-500, Lantus 100, Novolog 100, Fiasp, or Levemir Flextouch

## 2020-06-05 NOTE — Progress Notes (Signed)
Patient ID: Brian Braun, male   DOB: 1985/09/23, 35 y.o.   MRN: [id number]   This visit occurred during the SARS-CoV-2 public health emergency.  Safety protocols were in place, including screening questions prior to the visit, additional usage of staff PPE, and extensive cleaning of exam room while observing appropriate contact time as indicated for disinfecting solutions.   HPI: Brian Braun is a 35 y.o.-year-old male, returning for f/u for DM1, dx 1996, uncontrolled, with long-term complications (DR). Last visit 4 months ago. 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 He continue to be less active than before. Sugars are higher.  Reviewed HbA1c levels: Lab Results  Component Value Date   HGBA1C 7.6 (A) 01/29/2020   HGBA1C 7.7 (A) 09/27/2019   HGBA1C 7.6 (A) 12/11/2018  01/2019: work: HbA1c 7.0% 01/2018: HbA1c (work): 6.8% (Novolog) 03/25/2016: HbA1c 6.2% 10/11/2013: 7.6% 06/13/2013: 7.3%  He was previously on the Medtronic MiniMed insulin pump up to 2011, but he developed DKA episodes and now he is not interested in restarting the pump.  He is on: - Lantus 40-42 >> 35 >> 40 to 42 units daily - Humalog: 8-10  >> 10-14 units before meals Target 120 ISF 30 This means: - 120-150: + 1 unit  - 151-180: + 2 units  - 181-210: + 3 units  - 210-240: + 4 units  - >240: + 5 units  Glucometer: Accu-Chek expert meter >> One Touch Verio He continues on Humalog, which is not as efficient for him as NovoLog, but he had a very high co-pay for NovoLog ($600) while for Humalog he only has to pay $25.  Pt checks his sugars 4 times a day: - am: 100, 150-160 >> 120s >> 120-130 >> 150-200 - 2h after b'fast: n/c (may have a snack before lunch) - before lunch: 160-170 (snack) >> 170-200 >> 120-180 - 2h after lunch: n/c (snack) - before dinner: 85-180 >> 160-200 >> 120-160 >> 180-215 - 2h after dinner:  n/c  - bedtime: 150-160 >> 150s, 240 >> 110-140 >> 200-250 - nighttime: before eating - 140-150s >> n/c Lowest sugar was 70 >> .Marland KitchenMarland Kitchen. 85 >> 120;  he has hypoglycemia awareness in the 60s. He has an unexpired glucagon kit at home. Highest sugar was 240 >> 300 >> 250. He had one DKA admission in 2011.  Pt's meals are: - Breakfast: oatmeal or bacon and eggs + cheese biscuit - Lunch: sandwich or hamburger - Dinner: grilled chicken and rice; spaghetti; sloppy joes - Snacks:PB crackers at 10 am; cheeze it's   No history of CKD, last BUN/creatinine: Lab Results  Component Value Date   BUN 7 09/27/2019   CREATININE 1.36 09/27/2019   Lab Results  Component Value Date   GFR 59.65 (L) 09/27/2019   GFR 54.44 (L) 03/25/2015   GFR 78.07 04/09/2014   No MAU: Lab Results  Component Value Date   MICRALBCREAT 1.0 09/27/2019   MICRALBCREAT 1.2 03/13/2018   MICRALBCREAT 1.3 03/18/2017   MICRALBCREAT 2.1 03/29/2016   MICRALBCREAT 0.7 03/25/2015   He has a history of dyslipidemia but latest lipid panel was at goal: Lab Results  Component Value Date   CHOL 122 09/27/2019   HDL 40.60 09/27/2019   LDLCALC 64 09/27/2019   TRIG 89.0 09/27/2019   CHOLHDL 3 09/27/2019   Latest TSH was normal: Lab Results  Component Value Date   TSH 1.04  09/27/2019   - last eye exam was in 04/2019: + DR. New insurance does not cover eye exams. - no numbness and tingling in his feet.  ROS: Constitutional: no weight gain/no weight loss, no fatigue, no subjective hyperthermia, no subjective hypothermia Eyes: no blurry vision, no xerophthalmia ENT: no sore throat, no nodules palpated in neck, no dysphagia, no odynophagia, no hoarseness Cardiovascular: no CP/no SOB/no palpitations/no leg swelling Respiratory: no cough/no SOB/no wheezing Gastrointestinal: no N/no V/no D/no C/no acid reflux Musculoskeletal: no muscle aches/no joint aches Skin: no rashes, no hair loss Neurological: no tremors/no numbness/no  tingling/no dizziness  I reviewed pt's medications, allergies, PMH, social hx, family hx, and changes were documented in the history of present illness. Otherwise, unchanged from my initial visit note.  Past Medical History:  Diagnosis Date  . Diabetes mellitus without complication ([place])    Type I    Past Surgical History:  Procedure Laterality Date  . NECK SURGERY  07/2011   History   Social History  . Marital Status:  married     Spouse Name: N/A    Number of Children: 1   Occupational History  .  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 Smoking status: Never Smoker   . Smokeless tobacco: Current User    Types: Chew  . Alcohol Use: 0.0 oz/week    0 Not specified per week  . Drug Use: No   Social History Narrative   Married   1 stepson   Current Outpatient Medications  Medication Sig Dispense Refill  . ACCU-CHEK SOFTCLIX LANCETS lancets USE TO TEST BLOOD SUGAR 4 TIMES A DAY 200 each 11  . glucagon (GLUCAGON EMERGENCY) 1 MG injection Inject 1 mg into the muscle once as needed. 2 each prn  . Glucagon 3 MG/DOSE POWD Place 3 mg into the nose once as needed for up to 1 dose. 1 each 11  . insulin aspart (FIASP FLEXTOUCH) 100 UNIT/ML FlexTouch Pen Inject under skin 8-14 units before meals 30 mL 3  . insulin glargine (LANTUS SOLOSTAR) 100 UNIT/ML Solostar Pen Inject 42 Units into the skin at bedtime. 30 mL 3  . insulin lispro (HUMALOG KWIKPEN) 100 UNIT/ML KwikPen Inject 8-15 Units into the skin 3 (three) times daily. 30 mL 3  . Insulin Pen Needle (CLICKFINE PEN NEEDLES) 32G X 4 MM MISC Use 4x a day 400 each 3  . ONETOUCH VERIO test strip USE TO CHECK BLOOD SUGAR 4 TIMES A DAY 400 strip 12   No current facility-administered medications for this visit.   Allergies  Allergen Reactions  . Basaglar Kwikpen [Insulin Glargine] Other (See Comments)     Ineffective  . Humalog [Insulin Lispro] Other (See Comments)    Ineffective   Family history: None  PE: BP 130/90   Pulse 85   Ht '5\' 6"'  (1.676 m)   Wt 151 lb (68.5 kg)   SpO2 96%   BMI 24.37 kg/m  Body mass index is 24.37 kg/m. Wt Readings from Last 3 Encounters:  06/05/20 151 lb (68.5 kg)  01/29/20 147 lb 6.4 oz (66.9 kg)  10/10/19 152 lb 6.4 oz (69.1 kg)   Constitutional: overweight, in NAD Eyes: PERRLA, EOMI, no exophthalmos ENT: moist mucous membranes, no thyromegaly, no cervical lymphadenopathy Cardiovascular: RRR, No MRG Respiratory: CTA B Gastrointestinal: abdomen soft, NT, ND, BS+ Musculoskeletal: no deformities, strength  intact in all 4 Skin: moist, warm, no rashes Neurological: no tremor with outstretched hands, DTR normal in all 4  ASSESSMENT: 1. DM1, uncontrolled, with complications (background DR)  -He keeps his blood sugars higher during the summer on purpose, as he is more active, mowing -Per  records from Dr. Gabriel Carina, patient's diabetes was never very uncontrolled, his range is 7.2-7.6%. I suspect that he still has some insulin production from his pancreas.  -He is not interested in an insulin pump.  2. Dyslipidemia  PLAN:  1. Patient with longstanding, fairly well-controlled type 1 diabetes, with worse control in the past whenever his insurance forced him to switch from Lantus to [employer] and from NovoLog to Humalog. We had him on Tresiba in the past which worked very well for him but he had to switch to another insulin since this was not covered anymore.  -At last visit, sugars were higher after he changed his job to a more sedentary one, and he was not using the entire 10 units of Humalog recommended for meals. He was mostly rotating between 8 and 9 units only rarely 10 units. I recommended to increase the dose up to 14 units his sugars after meals were higher. He was also having problems injecting the insulin 15 minutes before each meal and I suggested  Fiasp, which could be injected at the start of the meal, but these were not covered. He is now back on Humalog. At last visit HbA1c was slightly better, at 7.6%. -At this visit, sugars are higher than before, as he mentions that he continues to be quite less active than before.  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  Sugars appear to be all higher than goal, especially in the morning and after dinner.  Upon questioning, he is using the recommended dose of Lantus but he is using the mealtime Humalog without adding the sliding scale.  We discussed about adding sliding scale to correct high blood sugars before meals and I also recommended Lyumjev, which may be covered for him.  This is injected at the start of the meal rather than 15 minutes before.  I also advised him to try to split the Lantus dose for better coverage of his blood sugars throughout the day.  He mentions that Lantus is expensive for him $200 per box.  However, he likes Lantus as he feels that he this insulin is working best for him. - I advised him to:  Patient Instructions  Please split: - Lantus 22 units 2x a day  Try to change from Humalog to Lyumjev: 10-14 units before meals + sliding scale Target 120 ISF 30 This means: - 120-150: + 1 unit  - 151-180: + 2 units  - 181-210: + 3 units  - 210-240: + 4 units  - >240: + 5 units  Please come back for a follow-up appointment in 4 months.    - we checked his HbA1c: 8.5% (higher) - advised to check sugars at different times of the day - 4x a day, rotating check times - advised for yearly eye exams >> he is not UTD -this is not covered by his insurance but plans to go to [employer]. - return to clinic in 4 months   2.  Dyslipidemia -Reviewed latest lipid panel from 09/2019: All fractions at goal Lab Results  Component Value Date   CHOL 122 09/27/2019   HDL 40.60 09/27/2019   LDLCALC 64 09/27/2019   TRIG  89.0 09/27/2019   CHOLHDL 3 09/27/2019   -He is not on a statin -We will recheck this at next visit  Philemon Kingdom, MD PhD [facility] Endocrinology

## 2020-06-05 NOTE — Patient Instructions (Addendum)
Please split: - Lantus 22 units 2x a day  Try to change from Humalog to Lyumjev: 10-14 units before meals + sliding scale Target 120 ISF 30 This means: - 120-150: + 1 unit  - 151-180: + 2 units  - 181-210: + 3 units  - 210-240: + 4 units  - >240: + 5 units  Please come back for a follow-up appointment in 4 months.

## 2020-06-13 NOTE — Telephone Encounter (Signed)
OK to send FiASP same doses

## 2020-06-16 NOTE — Telephone Encounter (Signed)
Rx sent to preferred pharmacy.

## 2020-08-18 ENCOUNTER — Telehealth: Payer: Self-pay | Admitting: Internal Medicine

## 2020-08-18 ENCOUNTER — Other Ambulatory Visit: Payer: Self-pay | Admitting: *Deleted

## 2020-08-18 DIAGNOSIS — E103299 Type 1 diabetes mellitus with mild nonproliferative diabetic retinopathy without macular edema, unspecified eye: Secondary | ICD-10-CM

## 2020-08-18 NOTE — Telephone Encounter (Signed)
REFILL REQUEST:  lantus and fiasp - patient is completely out of lantus  PHARMACY:  [id number] [place], [place] - [address] Phone:  [phone]  Fax:  [phone]     FOLLOW UP SCHEDULED:  10/09/20

## 2020-08-19 MED ORDER — INSULIN GLARGINE 100 UNITS/ML SOLOSTAR PEN: 22.0000 [IU] | PEN_INJECTOR | Freq: Every day | SUBCUTANEOUS | 1 refills | Status: DC

## 2020-08-19 NOTE — Telephone Encounter (Signed)
Rx faxed

## 2020-09-17 ENCOUNTER — Encounter: Payer: Self-pay | Admitting: Internal Medicine

## 2020-10-09 ENCOUNTER — Encounter: Payer: Self-pay | Admitting: Internal Medicine

## 2020-10-09 ENCOUNTER — Other Ambulatory Visit: Payer: Self-pay

## 2020-10-09 ENCOUNTER — Ambulatory Visit: Payer: BC Managed Care – PPO | Admitting: Internal Medicine

## 2020-10-09 VITALS — BP 124/84 | HR 82 | Ht 65.0 in | Wt 158.0 lb

## 2020-10-09 DIAGNOSIS — E785 Hyperlipidemia, unspecified: Secondary | ICD-10-CM | POA: Diagnosis not present

## 2020-10-09 DIAGNOSIS — E103299 Type 1 diabetes mellitus with mild nonproliferative diabetic retinopathy without macular edema, unspecified eye: Secondary | ICD-10-CM

## 2020-10-09 LAB — COMPLETE METABOLIC PANEL WITH GFR
AG Ratio: 1.7 (calc) (ref 1.0–2.5)
ALT: 22 U/L (ref 9–46)
AST: 19 U/L (ref 10–40)
Albumin: 4.7 g/dL (ref 3.6–5.1)
Alkaline phosphatase (APISO): 64 U/L (ref 36–130)
BUN/Creatinine Ratio: 6 (calc) (ref 6–22)
BUN: 10 mg/dL (ref 7–25)
CO2: 27 mmol/L (ref 20–32)
Calcium: 10.1 mg/dL (ref 8.6–10.3)
Chloride: 104 mmol/L (ref 98–110)
Creat: 1.62 mg/dL — ABNORMAL HIGH (ref 0.60–1.35)
GFR, Est African American: 63 mL/min/{1.73_m2} (ref >=60)
GFR, Est Non African American: 54 mL/min/{1.73_m2} — ABNORMAL LOW (ref >=60)
Globulin: 2.8 g/dL (calc) (ref 1.9–3.7)
Glucose, Bld: 56 mg/dL — ABNORMAL LOW (ref 65–99)
Potassium: 4.7 mmol/L (ref 3.5–5.3)
Sodium: 141 mmol/L (ref 135–146)
Total Bilirubin: 0.7 mg/dL (ref 0.2–1.2)
Total Protein: 7.5 g/dL (ref 6.1–8.1)

## 2020-10-09 LAB — LIPID PANEL
Cholesterol: 127 mg/dL (ref 0–200)
HDL: 43.8 mg/dL (ref >39.00)
LDL Cholesterol: 60 mg/dL (ref 0–99)
NonHDL: 83.39
Total CHOL/HDL Ratio: 3
Triglycerides: 118 mg/dL (ref 0.0–149.0)
VLDL: 23.6 mg/dL (ref 0.0–40.0)

## 2020-10-09 LAB — MICROALBUMIN / CREATININE URINE RATIO
Creatinine,U: 168.2 mg/dL
Microalb Creat Ratio: 0.8 mg/g (ref 0.0–30.0)
Microalb, Ur: 1.3 mg/dL (ref 0.0–1.9)

## 2020-10-09 LAB — POCT GLYCOSYLATED HEMOGLOBIN (HGB A1C): Hemoglobin A1C: 7 % — AB (ref 4.0–5.6)

## 2020-10-09 LAB — TSH: TSH: 1.35 u[IU]/mL (ref 0.35–4.50)

## 2020-10-09 NOTE — Patient Instructions (Addendum)
Please continue: - Semglee 16 units in am and 22 units at night - FiAsp: AT THE START OF EACH MEAL: 10-14 units before meals + sliding scale Target 120 ISF 30 This means: - 120-150: + 1 unit  - 151-180: + 2 units  - 181-210: + 3 units  - 210-240: + 4 units  - >240: + 5 units  Please come back for a follow-up appointment in 4 months.

## 2020-10-09 NOTE — Progress Notes (Signed)
Patient ID: Brian Braun, male   DOB: 1985/12/04, 35 y.o.   MRN: [id number]   This visit occurred during the SARS-CoV-2 public health emergency.  Safety protocols were in place, including screening questions prior to the visit, additional usage of staff PPE, and extensive cleaning of exam room while observing appropriate contact time as indicated for disinfecting solutions.   HPI: Brian Braun is a 35 y.o.-year-old male, returning for f/u for DM1, dx 1996, uncontrolled, with long-term complications (DR). Last visit 4 months ago. He is here with his wife who offers part of the hx especially related to work schedule, mealtimes, insulin dosing. Before last OV, changed to: [employer]   Interim hx: No increased urination, blurry vision, nausea, chest pain. 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   Reviewed HbA1c levels: Lab Results  Component Value Date   HGBA1C 8.5 (A) 06/05/2020   HGBA1C 7.6 (A) 01/29/2020   HGBA1C 7.7 (A) 09/27/2019  01/2019: work: HbA1c 7.0% 01/2018: HbA1c (work): 6.8% (Novolog) 03/25/2016: HbA1c 6.2% 10/11/2013: 7.6% 06/13/2013: 7.3%  He was previously on the Medtronic MiniMed insulin pump up to 2011, but he developed DKA episodes and now he is not interested in restarting the pump.  He is on: - Lantus 40-42 >> 35 >> 40 to 42 units daily >> 22 units 2x a day >> Semglee 16 units in am and 22 units at night - Humalog >> FiAsp - taken after meals: 8-10  >> 10-14 units Target 120 ISF 30 This means: - 120-150: + 1 unit  - 151-180: + 2 units  - 181-210: + 3 units  - 210-240: + 4 units  - >240: + 5 units  Glucometer: Accu-Chek expert meter >> One Touch Verio He continues on Humalog, which is not as efficient for him as NovoLog, but he had a very high co-pay for NovoLog ($600) while for Humalog he only has to pay $25.  Pt checks his sugars 4 times a day per review of his meter download: - am:  100, 150-160 >> 120s >> 120-130 >> 150-200 >> 41, 74-121, 246 - 2h after b'fast: n/c >> 211-309, 403 (may have a snack before lunch) - before lunch: 160-170 (snack) >> 170-200 >> 120-180 >> 70-230, 413 - 2h after lunch: n/c >> 84-388 (snack) - before dinner: 85-180 >> 160-200 >> 120-160 >> 180-215 >> 48 - 2h after dinner: n/c  - bedtime: 150-160 >> 150s, 240 >> 110-140 >> 200-250 >> 107, 195-471 - nighttime: before eating - 140-150s >> n/c Lowest sugar was 70 >> .Marland KitchenMarland Kitchen. 85 >> 120 >> 41, 48;  he has hypoglycemia awareness in the 60s. He has an unexpired glucagon kit at home. Highest sugar was 240 >> 300 >> 250 >> 471 He had one DKA admission in 2011.  Pt's meals are: - Breakfast: oatmeal or bacon and eggs + cheese biscuit - Lunch: sandwich or hamburger - Dinner: grilled chicken and rice; spaghetti; sloppy joes - Snacks:PB crackers at 10 am; cheeze it's   No history of CKD, last BUN/creatinine: Lab Results  Component Value Date   BUN 7 09/27/2019   CREATININE 1.36 09/27/2019   Lab Results  Component Value Date   GFR 59.65 (L) 09/27/2019   GFR 54.44 (L) 03/25/2015   GFR 78.07 04/09/2014   No MAU: Lab Results  Component Value Date   MICRALBCREAT 1.0 09/27/2019   MICRALBCREAT 1.2 03/13/2018   MICRALBCREAT 1.3  03/18/2017   MICRALBCREAT 2.1 03/29/2016   MICRALBCREAT 0.7 03/25/2015   He has a history of dyslipidemia but latest lipid panel was at goal: Lab Results  Component Value Date   CHOL 122 09/27/2019   HDL 40.60 09/27/2019   LDLCALC 64 09/27/2019   TRIG 89.0 09/27/2019   CHOLHDL 3 09/27/2019   Latest TSH was normal: Lab Results  Component Value Date   TSH 1.04 09/27/2019   - last eye exam was in 04/2019: + DR. New insurance does not cover eye exams. - no numbness and tingling in his feet.  ROS: Constitutional: no weight gain/no weight loss, no fatigue, no subjective hyperthermia, no subjective hypothermia Eyes: no blurry vision, no xerophthalmia ENT: no sore  throat, no nodules palpated in neck, no dysphagia, no odynophagia, no hoarseness Cardiovascular: no CP/no SOB/no palpitations/no leg swelling Respiratory: no cough/no SOB/no wheezing Gastrointestinal: no N/no V/no D/no C/no acid reflux Musculoskeletal: no muscle aches/no joint aches Skin: no rashes, no hair loss Neurological: no tremors/no numbness/no tingling/no dizziness  I reviewed pt's medications, allergies, PMH, social hx, family hx, and changes were documented in the history of present illness. Otherwise, unchanged from my initial visit note.  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 1 each 11   insulin aspart (FIASP FLEXTOUCH) 100 UNIT/ML FlexTouch Pen Inject 45 Units into the skin daily. 15 mL 1   insulin glargine (LANTUS) 100 unit/mL SOPN Inject 22 Units into the skin daily. 18 mL 1   insulin lispro (HUMALOG KWIKPEN) 100 UNIT/ML KwikPen Inject 8-15 Units into the skin 3 (three) times daily. 30 mL 3   Insulin Pen Needle (CLICKFINE PEN NEEDLES) 32G X 4 MM MISC Use 4x a day 400 each 3   ONETOUCH VERIO test strip USE TO  CHECK BLOOD SUGAR 4 TIMES A DAY 400 strip 12   No current facility-administered medications for this visit.   Allergies  Allergen Reactions   Basaglar Kwikpen [Insulin Glargine] Other (See Comments)    Ineffective   Humalog [Insulin Lispro] Other (See Comments)    Ineffective   Family history: None  PE: BP 124/84   Pulse 82   Ht _0  (1.651 m)   Wt 158 lb (71.7 kg)   SpO2 99%   BMI 26.29 kg/m  Body mass index is 26.29 kg/m. Wt Readings from Last 3 Encounters:  10/09/20 158 lb (71.7 kg)  06/05/20 151  lb (68.5 kg)  01/29/20 147 lb 6.4 oz (66.9 kg)   Constitutional: overweight, in NAD Eyes: PERRLA, EOMI, no exophthalmos ENT: moist mucous membranes, no thyromegaly, no cervical lymphadenopathy Cardiovascular: RRR, No MRG Respiratory: CTA B Gastrointestinal: abdomen soft, NT, ND, BS+ Musculoskeletal: no deformities, strength intact in all 4 Skin: moist, warm, no rashes Neurological: no tremor with outstretched hands, DTR normal in all 4  ASSESSMENT: 1. DM1, uncontrolled, with complications (background DR)  -He keeps his blood sugars higher during the summer on purpose, as he is more active, mowing -Per  records from Dr. Gabriel Carina, patient's diabetes was never very uncontrolled, his range is 7.2-7.6%. I suspect that he still has some insulin production from his pancreas.  -He is not interested in an insulin pump.  2. Dyslipidemia  PLAN:  1. Patient with longstanding, fairly well-controlled type 1 diabetes, with increased variability in his blood sugars after he changed his job to a more sedentary one and waking up very early to go to work.  Upon questioning, he wakes up at  12-1 AM and starts driving at 2-4 AM.  He is not getting enough sleep, but he does not feel tired because of this.  At last visit, sugars are higher and an HbA1c was also higher, at 8.5%.  At that time, we split his Lantus.  Since then, we changed to [facility] per insurance preference.  We also changed from  Humalog to [place].  I did advise him at last visit this is injected at the start of the meal, rather than 15 minutes before. -At this visit, sugars are extremely variable, per review of his meter downloads.  They are ranging between 41 to upper 400s.  He had several low blood sugars, at different times of the day, without consistency.  A general pattern appears to be: High blood sugars after dinner, and upon questioning, he is taking Fiasp off and the sugars are already high postprandially.  As a consequence, he is dropping his sugars in the middle of the night and they increase again after breakfast.  We discussed at this visit about the absolute importance of taking Fiasp before he starts eating to avoid variability in his blood sugars.  I believe that his low blood sugars are due to taking insulin too late.  He was afraid that Semglee could stack with [place] and I advised him that this is actually not the case.  I advised him to take [facility] exactly 12 hours apart if possible but not to worry about having to eat when he takes it.  For now, we discussed about continuing the same doses while we improve compliance with insulin dosing. - I advised him to:  Patient Instructions  Please continue: - Semglee 16 units in am and 22 units at night - FiAsp: AT THE START OF EACH MEAL: 10-14 units before meals + sliding scale Target 120 ISF 30 This means: - 120-150: + 1 unit  - 151-180: + 2 units  - 181-210: + 3 units  - 210-240: + 4 units  - >240: + 5 units  Please come back for a follow-up appointment in 4 months.    - we checked his HbA1c: 7.0% (better) - advised to check sugars at different times of the day - 4x a day, rotating check times - advised for yearly eye exams >> he is not UTD - At today's visit, I gave him a letter for his job regarding his HbA1c, vitals, and medications. -We will check annual  labs - return to clinic in 4 months   2.  Dyslipidemia -Reviewed latest lipid panel from  09/2019: All fractions at goal: Lab Results  Component Value Date   CHOL 122 09/27/2019   HDL 40.60 09/27/2019   LDLCALC 64 09/27/2019   TRIG 89.0 09/27/2019   CHOLHDL 3 09/27/2019  -He is not on a statin -We will check his lipid panel today  Component     Latest Ref Rng & Units 10/09/2020  Glucose     65 - 99 mg/dL 56 (L)  BUN     7 - 25 mg/dL 10  Creatinine     0.60 - 1.35 mg/dL 1.62 (H)  GFR, Est Non African American     > OR = 60 mL/min/1.53m 54 (L)  GFR, Est African American     > OR = 60 mL/min/1.717m63  BUN/Creatinine Ratio     6 - 22 (calc) 6  Sodium     135 - 146 mmol/L 141  Potassium     3.5 - 5.3 mmol/L 4.7  Chloride     98 - 110 mmol/L 104  CO2     20 - 32 mmol/L 27  Calcium     8.6 - 10.3 mg/dL 10.1  Total Protein     6.1 - 8.1 g/dL 7.5  Albumin MSPROF     3.6 - 5.1 g/dL 4.7  Globulin     1.9 - 3.7 g/dL (calc) 2.8  AG Ratio     1.0 - 2.5 (calc) 1.7  Total Bilirubin     0.2 - 1.2 mg/dL 0.7  Alkaline phosphatase (APISO)     36 - 130 U/L 64  AST     10 - 40 U/L 19  ALT     9 - 46 U/L 22  Cholesterol     0 - 200 mg/dL 127  Triglycerides     0.0 - 149.0 mg/dL 118.0  HDL Cholesterol     >39.00 mg/dL 43.80  VLDL     0.0 - 40.0 mg/dL 23.6  LDL (calc)     0 - 99 mg/dL 60  Total CHOL/HDL Ratio      3  NonHDL      83.39  Microalb, Ur     0.0 - 1.9 mg/dL 1.3  Creatinine,U     mg/dL 168.2  MICROALB/CREAT RATIO     0.0 - 30.0 mg/g 0.8  Hemoglobin A1C     4.0 - 5.6 % 7.0 (A)  TSH     0.35 - 4.50 uIU/mL 1.35   Labs are at goal with exception of a low glucose and also higher creatinine.  Albumin to creatinine ratio is normal.  We will keep an eye on his kidney function.  I will advise him to stay well-hydrated.  CrPhilemon KingdomMD PhD [facility]

## 2020-10-10 ENCOUNTER — Encounter: Payer: Self-pay | Admitting: Internal Medicine

## 2020-10-10 NOTE — Telephone Encounter (Signed)
Patient called to check status of "request Dr. Elvera Lennox write a letter for DOT (Per Urgent Care visit 10/09/20 after Patient left appointment with Dr. 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 Elvera Lennox and faxed to [facility] in [place] on [address], Fax# [phone].  Patient states the above letter is required in order for Patient to work on Monday 10/13/20.

## 2020-10-10 NOTE — Telephone Encounter (Signed)
Brian Braun called to request Dr. Elvera Lennox write a letter for DOT (Per Urgent Care visit 10/09/20 after Brian Braun left appointment with Dr. 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 Elvera Lennox and faxed to [facility] in [place] on [address], Fax# [phone]. Brian Braun states the above letter is required in order for Brian Braun to work on Monday 10/13/20.  Brian Braun requests to be called at ph# [phone] once the above letter has been executed so that he knows that letter was faxed. Brian Braun states he will come to office to pick up letter if he needs to.  Brian Braun states the letter Dr. 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

## 2020-10-10 NOTE — Telephone Encounter (Signed)
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

## 2020-12-09 ENCOUNTER — Other Ambulatory Visit: Payer: Self-pay | Admitting: Internal Medicine

## 2020-12-11 ENCOUNTER — Other Ambulatory Visit: Payer: Self-pay | Admitting: Internal Medicine

## 2021-02-12 ENCOUNTER — Encounter: Payer: Self-pay | Admitting: Internal Medicine

## 2021-02-12 ENCOUNTER — Other Ambulatory Visit: Payer: Self-pay

## 2021-02-12 ENCOUNTER — Ambulatory Visit: Payer: BC Managed Care – PPO | Admitting: Internal Medicine

## 2021-02-12 VITALS — BP 130/80 | HR 92 | Ht 65.0 in | Wt 162.8 lb

## 2021-02-12 DIAGNOSIS — Z23 Encounter for immunization: Secondary | ICD-10-CM | POA: Diagnosis not present

## 2021-02-12 DIAGNOSIS — E103299 Type 1 diabetes mellitus with mild nonproliferative diabetic retinopathy without macular edema, unspecified eye: Secondary | ICD-10-CM | POA: Diagnosis not present

## 2021-02-12 DIAGNOSIS — E785 Hyperlipidemia, unspecified: Secondary | ICD-10-CM | POA: Diagnosis not present

## 2021-02-12 LAB — POCT GLYCOSYLATED HEMOGLOBIN (HGB A1C): Hemoglobin A1C: 7.7 % — AB (ref 4.0–5.6)

## 2021-02-12 MED ORDER — FREESTYLE LIBRE 3 SENSOR MISC: 1.0000 | 3 refills | Status: DC

## 2021-02-12 MED ORDER — GLUCAGON 3 MG/DOSE NA POWD: 3.0000 mg | Freq: Once | NASAL | 11 refills | Status: DC | PRN

## 2021-02-12 NOTE — Progress Notes (Signed)
Patient ID: Brian Braun, male   DOB: [birth year], 35 y.o.   MRN: [id number]   This visit occurred during the SARS-CoV-2 public health emergency.  Safety protocols were in place, including screening questions prior to the visit, additional usage of staff PPE, and extensive cleaning of exam room while observing appropriate contact time as indicated for disinfecting solutions.   HPI: Brian Braun is a 35 y.o.-year-old male, returning for f/u for DM1, dx 1996, uncontrolled, with long-term complications (DR). Last visit 4 months ago. He is here with his wife who offers part of the hx especially related to work schedule, mealtimes, insulin dosing. Before last OV, changed to: [employer]   Interim hx: He denies increased urination, blurry vision, nausea, chest pain. 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  Reviewed HbA1c levels: Lab Results  Component Value Date   HGBA1C 7.0 (A) 10/09/2020   HGBA1C 8.5 (A) 06/05/2020   HGBA1C 7.6 (A) 01/29/2020  01/2019: work: HbA1c 7.0% 01/2018: HbA1c (work): 6.8% (Novolog) 03/25/2016: HbA1c 6.2% 10/11/2013: 7.6% 06/13/2013: 7.3%  He was previously on the Medtronic MiniMed insulin pump up to 2011, but he developed DKA episodes and now he is not interested in restarting the pump.  He is on: - Lantus 40-42 >> ... >> Semglee 16 units in am and 22 units at night - Humalog >> FiAsp - taken after meals >> moved before meals 8-10  >> 10-14 units Target 120 ISF 30 This means: - 120-150: + 1 unit  - 151-180: + 2 units  - 181-210: + 3 units  - 210-240: + 4 units  - >240: + 5 units  Glucometer: Accu-Chek expert meter >> One Touch Verio He continues on Humalog, which is not as efficient for him as NovoLog, but he had a very high co-pay for NovoLog ($600) while for Humalog he only has to pay $25.  Pt checks his sugars 4 times a day  - forgot his meter, per his recall: - am: 120-130 >> 150-200 >> 41,  74-121, 246 >> 150-160s - 2h after b'fast: n/c >> 211-309, 403 >> 120-130s (may have a snack before lunch) - before lunch: 170-200 >> 120-180 >> 70-230, 413  >> 160 (PB + jelly) - 2h after lunch: n/c >> 84-388 >> 180 (snack) - before dinner: 120-160 >> 180-215 >> 48 >> 140-208 - 2h after dinner: n/c  - bedtime: 110-140 >> 200-250 >> 107, 195-471 >> 140-150 - nighttime: before eating - 140-150s >> n/c Lowest sugar was 120 >> 41, 48 >> 70;  he has hypoglycemia awareness in the 60s. He has an unexpired glucagon kit at home. Highest sugar was 240 >> 300 >> 250 >> 471 >> 300 x1 He had one DKA admission in 2011.  Pt's meals are: - Breakfast: oatmeal or bacon and eggs + cheese biscuit - Lunch: sandwich or hamburger - Dinner: grilled chicken and rice; spaghetti; sloppy joes - Snacks:PB crackers at 10 am; cheeze it's   No history of CKD, last BUN/creatinine: Lab Results  Component Value Date   BUN 10 10/09/2020   CREATININE 1.62 (H) 10/09/2020   Lab Results  Component Value Date   GFR 59.65 (L) 09/27/2019   GFR 54.44 (L) 03/25/2015   GFR 78.07 04/09/2014   No MAU: Lab Results  Component Value Date   MICRALBCREAT 0.8 10/09/2020   MICRALBCREAT 1.0 09/27/2019   MICRALBCREAT 1.2 03/13/2018   MICRALBCREAT 1.3 03/18/2017   MICRALBCREAT 2.1  03/29/2016   He has a history of dyslipidemia but latest lipid panel was at goal: Lab Results  Component Value Date   CHOL 127 10/09/2020   HDL 43.80 10/09/2020   LDLCALC 60 10/09/2020   TRIG 118.0 10/09/2020   CHOLHDL 3 10/09/2020   Latest TSH was normal: Lab Results  Component Value Date   TSH 1.35 10/09/2020   - last eye exam was in 2022: + DR, stable, reportedly.   - no numbness and tingling in his feet.  ROS: + see HPI  I reviewed pt's medications, allergies, PMH, social hx, family hx, and changes were documented in the history of present illness. Otherwise, unchanged from my initial visit note.  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 1 each 11   insulin aspart (FIASP FLEXTOUCH) 100 UNIT/ML FlexTouch Pen Inject 45 Units into the skin daily. 15 mL 1   insulin glargine (LANTUS) 100 unit/mL SOPN Inject 22 Units into the skin daily. 18 mL 1   insulin lispro (HUMALOG KWIKPEN) 100 UNIT/ML KwikPen Inject 8-15 Units into the skin 3 (three) times daily. 30 mL 3   Insulin Pen Needle (CLICKFINE PEN NEEDLES) 32G X 4 MM MISC Use 4x a day 400 each 3   ONETOUCH VERIO test strip USE TO CHECK BLOOD SUGAR 4 TIMES A DAY 400 strip 12   SEMGLEE, YFGN, 100 UNIT/ML Pen INJECT 42 UNITS INTO THE SKIN AT BEDTIME 45 mL 3   No current facility-administered medications for this visit.   Allergies  Allergen Reactions   Basaglar Kwikpen [Insulin Glargine] Other (See Comments)    Ineffective   Humalog [Insulin Lispro] Other (See Comments)    Ineffective   Family history: None  PE: There were no vitals taken for this visit. There is no height or weight on file to calculate BMI. Wt Readings from Last 3 Encounters:  10/09/20 158 lb (71.7 kg)  06/05/20 151 lb (68.5  kg)  01/29/20 147 lb 6.4 oz (66.9 kg)   Constitutional: overweight, in NAD Eyes: PERRLA, EOMI, no exophthalmos ENT: moist mucous membranes, no thyromegaly, no cervical lymphadenopathy Cardiovascular: RRR, No MRG Respiratory: CTA B Gastrointestinal: abdomen soft, NT, ND, BS+ Musculoskeletal: no deformities, strength intact in all 4 Skin: moist, warm, no rashes Neurological: no tremor with outstretched hands, DTR normal in all 4  ASSESSMENT: 1. DM1, uncontrolled, with complications (background DR)  -He keeps his blood  sugars higher during the summer on purpose, as he is more active, mowing -Per  records from Dr. Gabriel Carina, patient's diabetes was never very uncontrolled, his range is 7.2-7.6%. I suspect that he still has some insulin production from his pancreas.  -He is not interested in an insulin pump.  2. Dyslipidemia  PLAN:  1. Patient with longstanding, fairly well-controlled type 1 diabetes, but with increased variability in blood sugars and higher HbA1c levels after he started a less active job.  HbA1c at last visit, however, was 7.0%, improved. -At last visit, his sugars were widely fluctuating up to 400s, as he was taking Fiasp after meals, rather than before meals.  I strongly advised him to move the insulin at the beginning of each meal.   -at this visit, per his recall (he forgot his blood sugar log/meter at home), they are much more controlled.  They are still above target in the morning and also before the next meals, but after meals, they appear to be much improved.  He mentions that he is not taking Fiasp at the start of each meal.  I do not feel that we need to change the doses of Fiasp.  However, I believe that he would benefit from a higher dose of Semglee and I advised him to increase the dose at night. -I also strongly encouraged him to get a freestyle libre CGM especially since he is driving so much and tells me that he does not have time to check his blood sugars every hour  when he is driving.  I showed him the device and advised him how it works.  I sent a prescription for this to his pharmacy and also gave him a written prescription for it. -At last visit, his creatinine was higher, possibly due to dehydration.  I plan to repeat this at next visit. - I advised him to:  Patient Instructions  Please increase: - Semglee 16 units in am and 26 units at night  Continue: - FiAsp:  10-14 units before meals + sliding scale Target 120 ISF 30 This means: - 120-150: + 1 unit  - 151-180: + 2 units  - 181-210: + 3 units  - 210-240: + 4 units  - >240: + 5 units  Please come back for a follow-up appointment in 4 months.  - we checked his HbA1c: 7.7% (higher) - advised to check sugars at different times of the day - 4x a day, rotating check times - advised for yearly eye exams >> he is UTD - return to clinic in 4 months   2.  Dyslipidemia -Reviewed latest lipid panel from 09/2020: All fractions at goal Lab Results  Component Value Date   CHOL 127 10/09/2020   HDL 43.80 10/09/2020   LDLCALC 60 10/09/2020   TRIG 118.0 10/09/2020   CHOLHDL 3 10/09/2020  -He is not on a statin  Philemon Kingdom, MD PhD [facility]ocrinology

## 2021-02-12 NOTE — Patient Instructions (Addendum)
Please increase: - Semglee 16 units in am and 26 units at night  Continue: - FiAsp:  10-14 units before meals + sliding scale Target 120 ISF 30 This means: - 120-150: + 1 unit  - 151-180: + 2 units  - 181-210: + 3 units  - 210-240: + 4 units  - >240: + 5 units  Please come back for a follow-up appointment in 4 months.

## 2021-03-01 ENCOUNTER — Other Ambulatory Visit: Payer: Self-pay | Admitting: Internal Medicine

## 2021-03-01 DIAGNOSIS — E103299 Type 1 diabetes mellitus with mild nonproliferative diabetic retinopathy without macular edema, unspecified eye: Secondary | ICD-10-CM

## 2021-05-03 DIAGNOSIS — R059 Cough, unspecified: Secondary | ICD-10-CM | POA: Diagnosis not present

## 2021-05-03 DIAGNOSIS — J069 Acute upper respiratory infection, unspecified: Secondary | ICD-10-CM | POA: Diagnosis not present

## 2021-05-21 ENCOUNTER — Telehealth: Payer: Self-pay

## 2021-05-21 NOTE — Telephone Encounter (Signed)
Inbound call from pt requesting a rx for a cough medication. Pt mentioned he has had this cough for over a week. Was prescribed prednisone but it has not helped. Advised pt he may have to follow up with a PCP or Urgent care provider for a rx.

## 2021-05-21 NOTE — Telephone Encounter (Signed)
Agree - per PCP or UC. He absolutely needs a PCP if he does not have one yet.

## 2021-05-22 NOTE — Telephone Encounter (Signed)
Called and left a detailed message advising he needs to follow up with PCP or urgent care.

## 2021-06-18 ENCOUNTER — Other Ambulatory Visit: Payer: Self-pay

## 2021-06-18 ENCOUNTER — Encounter: Payer: Self-pay | Admitting: Internal Medicine

## 2021-06-18 ENCOUNTER — Ambulatory Visit: Payer: BC Managed Care – PPO | Admitting: Internal Medicine

## 2021-06-18 VITALS — BP 120/86 | HR 96 | Ht 65.0 in | Wt 161.0 lb

## 2021-06-18 DIAGNOSIS — E785 Hyperlipidemia, unspecified: Secondary | ICD-10-CM | POA: Diagnosis not present

## 2021-06-18 DIAGNOSIS — E103299 Type 1 diabetes mellitus with mild nonproliferative diabetic retinopathy without macular edema, unspecified eye: Secondary | ICD-10-CM

## 2021-06-18 LAB — POCT GLYCOSYLATED HEMOGLOBIN (HGB A1C): Hemoglobin A1C: 7.7 % — AB (ref 4.0–5.6)

## 2021-06-18 NOTE — Patient Instructions (Addendum)
Please continue: ?- Semglee 16 units in am and 26 units at night ?- FiAsp:  ?10-14 units before meals + sliding scale ?Target 120 ?ISF 30 ?This means: ?- 120-150: + 1 unit  ?- 151-180: + 2 units  ?- 181-210: + 3 units  ?- 210-240: + 4 units  ?- >240: + 5 units ? ?Please start the sensor. ? ?Please come back for a follow-up appointment in 4 months. ?

## 2021-06-18 NOTE — Progress Notes (Unsigned)
Patient ID: Brian Braun, male   DOB: October 31, 1985, 36 y.o.   MRN: [id number]   This visit occurred during the SARS-CoV-2 public health emergency.  Safety protocols were in place, including screening questions prior to the visit, additional usage of staff PPE, and extensive cleaning of exam room while observing appropriate contact time as indicated for disinfecting solutions.   HPI: Brian Braun is a 36 y.o.-year-old male, returning for f/u for DM1, dx 1996, uncontrolled, with long-term complications (DR). Last visit 4 months ago.   Interim hx: He denies increased urination, blurry vision, nausea, chest pain. 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  Reviewed HbA1c levels: Lab Results  Component Value Date   HGBA1C 7.7 (A) 02/12/2021   HGBA1C 7.0 (A) 10/09/2020   HGBA1C 8.5 (A) 06/05/2020  01/2019: work: HbA1c 7.0% 01/2018: HbA1c (work): 6.8% (Novolog) 03/25/2016: HbA1c 6.2% 10/11/2013: 7.6% 06/13/2013: 7.3%  He was previously on the Medtronic MiniMed insulin pump up to 2011, but he developed DKA episodes and now he is not interested in restarting the pump.  He is on: - Lantus 40-42 >> ... >> Semglee 16 units in am and 22 >> 26 units at night - Humalog >> FiAsp - taken after meals >> moved before meals 8-10  >> 10(-14) units Target 120 ISF 30 This means: - 120-150: + 1 unit. - 151-180: + 2 units  - 181-210: + 3 units  - 210-240: + 4 units  - >240: + 5 units  Glucometer: Accu-Chek expert meter >> One Touch Verio He continues on Humalog, which is not as efficient for him as NovoLog, but he had a very high co-pay for NovoLog ($600) while for Humalog he only has to pay $25.  Pt checks his sugars 4 times a day: - am: 120-130 >> 150-200 >> 41, 74-121, 246 >> 150-160s >> 120-160 PB crackers - 2h after b'fast: n/c >> 211-309, 403 >> 120-130s >> 160-170 (7-8 am: b'fast at work) - before lunch: 120-180 >> 70-230, 413   >> 160 (PB + jelly) >> 120-160 (~activity) - 2h after lunch: n/c >> 84-388 >> 180 >> 170-180 (Snack 3p) - before dinner: 120-160 >> 180-215 >> 48 >> 140-208 >> 150-200 - 2h after dinner: n/c  - bedtime: 110-140 >> 200-250 >> 107, 195-471 >> 140-150 >> n/c - nighttime: before eating - 140-150s >> n/c Lowest sugar was 120 >> 41, 48 >> 70 >> 55 (took too much insulin  - smaller meal);  he has hypoglycemia awareness in the 60s. He has an unexpired glucagon kit at home. Highest sugar was 300 >> 250 >> 471 >> 300 x1 >> 250 He had one DKA admission in 2011.  Pt's meals are: - Breakfast: oatmeal or bacon and eggs + cheese biscuit - Lunch: sandwich or hamburger - Dinner: grilled chicken and rice; spaghetti; sloppy joes - Snacks:PB crackers at 10 am; cheeze it's   No history of CKD, last BUN/creatinine: Lab Results  Component Value Date   BUN 10 10/09/2020   CREATININE 1.62 (H) 10/09/2020   Lab Results  Component Value Date   GFR 59.65 (L) 09/27/2019   GFR 54.44 (L) 03/25/2015   GFR 78.07 04/09/2014   No MAU: Lab Results  Component Value Date   MICRALBCREAT 0.8 10/09/2020   MICRALBCREAT 1.0 09/27/2019   MICRALBCREAT 1.2 03/13/2018   MICRALBCREAT 1.3 03/18/2017   MICRALBCREAT 2.1 03/29/2016   He has a history of dyslipidemia but  latest lipid panel was at goal: Lab Results  Component Value Date   CHOL 127 10/09/2020   HDL 43.80 10/09/2020   LDLCALC 60 10/09/2020   TRIG 118.0 10/09/2020   CHOLHDL 3 10/09/2020   Latest TSH was normal: Lab Results  Component Value Date   TSH 1.35 10/09/2020   - last eye exam was in 2022: + DR, stable, reportedly. Coming up in 08/2021.  - no numbness and tingling in his feet.  ROS: + see HPI  I reviewed pt's medications, allergies, PMH, social hx, family hx, and changes were documented in the history of present illness. Otherwise, unchanged from my initial visit note.  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 6 each 3   FIASP FLEXTOUCH 100 UNIT/ML FlexTouch Pen INJECT UNDER SKIN 8-14 UNITS BEFORE MEALS 30 mL 1   Glucagon 3 MG/DOSE POWD Place 3 mg into the nose once as needed for up to 1 dose. 1 each 11   Insulin Pen Needle (CLICKFINE PEN NEEDLES) 32G X 4 MM MISC Use 4x a day 400 each 3   ONETOUCH VERIO test strip USE TO CHECK BLOOD SUGAR 4 TIMES A DAY 400 strip 12   SEMGLEE, YFGN, 100 UNIT/ML Pen INJECT 42 UNITS INTO THE SKIN AT BEDTIME 45 mL 3   No current facility-administered medications for this visit.   Allergies  Allergen Reactions   Basaglar Kwikpen [Insulin Glargine] Other (See Comments)    Ineffective   Humalog [Insulin Lispro] Other (See Comments)    Ineffective   Family history: None  PE: BP 120/86 (BP Location: Right Arm, Patient Position: Sitting, Cuff Size: Normal)    Pulse 96    Ht _0  (1.651 m)    Wt 161 lb (73 kg)    SpO2 98%    BMI 26.79 kg/m   Wt Readings from Last 3 Encounters:  06/18/21 161 lb (73 kg)  02/12/21 162 lb 12.8 oz (73.8 kg)  10/09/20 158 lb (71.7 kg)   Constitutional:  overweight, in NAD Eyes: PERRLA, EOMI, no exophthalmos ENT: moist mucous membranes, no thyromegaly, no cervical lymphadenopathy Cardiovascular: tachycardia, RR, No MRG Respiratory: CTA B Musculoskeletal: no deformities, strength intact in all 4 Skin: moist, warm, no rashes Neurological: no tremor with outstretched hands, DTR normal in all 4 Diabetic Foot Exam - Simple   Simple Foot Form Diabetic Foot exam was performed with  the following findings: Yes 06/18/2021  4:20 PM  Visual Inspection No deformities, no ulcerations, no other skin breakdown bilaterally: Yes Sensation Testing Intact to touch and monofilament testing bilaterally: Yes Pulse Check Posterior Tibialis and Dorsalis pulse intact bilaterally: Yes Comments    ASSESSMENT: 1. DM1, uncontrolled, with complications (background DR)  -He keeps his blood sugars higher during the summer on purpose, as he is more active, mowing -Per  records from Dr. Gabriel Carina, patient's diabetes was never very uncontrolled, his range is 7.2-7.6%. I suspect that he still has some insulin production from his pancreas.  -He is not interested in an insulin pump.  2. Dyslipidemia  PLAN:  1. Patient with longstanding, uncontrolled, type 1 diabetes, with increased variability in his blood sugars and higher HbA1c levels after he started a less active job.  At last visit HbA1c was higher, at 7.7%, increased from 7.0%.  At that time, he forgot his blood sugar log at home but he mentioned that the sugars started to improve.  They were still above target in the morning and before meals, but they were better after meals.  At that time, I did not feel that we needed to change Fiasp.  I again recommended a freestyle libre CGM especially since he mentions that he did not have time to check his blood sugars every hour while he was driving.  I showed him the device and advised him how it works.  I sent a prescription for this to his pharmacy and also gave him a written  prescription for it.   -At last visit his creatinine was higher, possibly due to dehydration.  We will repeat this today along with the rest of his annual labs. -At today's visit, sugars remain higher than target and most times of the day, with values in the 160s and 170s before meals possibly also due to snacks.  Before dinner, he has occasional blood sugars higher than this.  As of now, he is afraid to bolus Fiasp at the recommended doses for fear of dropping too low when he is working.  At today's visit, he has a freestyle libre CGM with him and I attached it on his left upper arm. We also showed him how to use the application to receive blood sugar information.  I believe that attaching the CGM will trigger behavioral modification and will see better blood sugars at next visit.  Therefore, I did not change his regimen at this time. - I advised him to:  Patient Instructions  Please continue: - Semglee 16 units in am and 26 units at night - FiAsp:  10-14 units before meals + sliding scale Target 120 ISF 30 This means: - 120-150: + 1 unit  - 151-180: + 2 units  - 181-210: + 3 units  - 210-240: + 4 units  - >240: + 5 units  Please start the sensor.  Please come back for a follow-up appointment in 4 months.  - we checked his HbA1c: 7.7% (stable) - advised to check sugars at different times of the day - 4x a day, rotating check times - advised for yearly eye exams >> he is UTD - will check annual labs today - foot exam performed today - return to clinic in 4 months   2.  Dyslipidemia -Reviewed latest lipid panel from 09/2020: All fractions at goal Lab Results  Component Value Date   CHOL 127 10/09/2020   HDL 43.80 10/09/2020   LDLCALC 60 10/09/2020   TRIG 118.0 10/09/2020  CHOLHDL 3 10/09/2020  -He is not on a statin -We will check his lipids today  Philemon Kingdom, MD PhD [facility]

## 2021-06-19 LAB — COMPREHENSIVE METABOLIC PANEL
ALT: 14 U/L (ref 0–53)
AST: 16 U/L (ref 0–37)
Albumin: 4.5 g/dL (ref 3.5–5.2)
Alkaline Phosphatase: 87 U/L (ref 39–117)
BUN: 12 mg/dL (ref 6–23)
CO2: 27 mEq/L (ref 19–32)
Calcium: 10.1 mg/dL (ref 8.4–10.5)
Chloride: 102 mEq/L (ref 96–112)
Creatinine, Ser: 1.33 mg/dL (ref 0.40–1.50)
GFR: 68.68 mL/min (ref >60.00)
Glucose, Bld: 238 mg/dL — ABNORMAL HIGH (ref 70–99)
Potassium: 4.2 mEq/L (ref 3.5–5.1)
Sodium: 137 mEq/L (ref 135–145)
Total Bilirubin: 0.6 mg/dL (ref 0.2–1.2)
Total Protein: 7.2 g/dL (ref 6.0–8.3)

## 2021-06-19 LAB — LIPID PANEL
Cholesterol: 139 mg/dL (ref 0–200)
HDL: 43.7 mg/dL (ref >39.00)
LDL Cholesterol: 69 mg/dL (ref 0–99)
NonHDL: 95.19
Total CHOL/HDL Ratio: 3
Triglycerides: 131 mg/dL (ref 0.0–149.0)
VLDL: 26.2 mg/dL (ref 0.0–40.0)

## 2021-06-19 LAB — TSH: TSH: 1.24 u[IU]/mL (ref 0.35–5.50)

## 2021-06-19 MED ORDER — FIASP FLEXTOUCH 100 UNIT/ML ~~LOC~~ SOPN: PEN_INJECTOR | SUBCUTANEOUS | 1 refills | Status: DC

## 2021-06-19 MED ORDER — FREESTYLE LIBRE 3 SENSOR MISC: 1.0000 | 3 refills | Status: DC

## 2021-08-18 ENCOUNTER — Ambulatory Visit: Payer: BC Managed Care – PPO | Admitting: Internal Medicine

## 2021-08-18 ENCOUNTER — Encounter: Payer: Self-pay | Admitting: Internal Medicine

## 2021-08-18 VITALS — BP 128/82 | HR 81 | Ht 65.0 in | Wt 166.0 lb

## 2021-08-18 DIAGNOSIS — Z23 Encounter for immunization: Secondary | ICD-10-CM

## 2021-08-18 DIAGNOSIS — E103299 Type 1 diabetes mellitus with mild nonproliferative diabetic retinopathy without macular edema, unspecified eye: Secondary | ICD-10-CM

## 2021-08-18 DIAGNOSIS — E785 Hyperlipidemia, unspecified: Secondary | ICD-10-CM | POA: Diagnosis not present

## 2021-08-18 LAB — POCT GLYCOSYLATED HEMOGLOBIN (HGB A1C): Hemoglobin A1C: 6.7 % — AB (ref 4.0–5.6)

## 2021-08-18 MED ORDER — INSULIN GLARGINE-YFGN 100 UNIT/ML ~~LOC~~ SOPN: PEN_INJECTOR | SUBCUTANEOUS | 3 refills | Status: DC

## 2021-08-18 NOTE — Patient Instructions (Signed)
Please decrease: ?- Semglee 12 units in am and 20 units at night ? ?Continue: ?- FiAsp:  ?10-14 units before meals + sliding scale ?Target 120 ?ISF 30 ?This means: ?- 120-150: + 1 unit  ?- 151-180: + 2 units  ?- 181-210: + 3 units  ?- 210-240: + 4 units  ?- >240: + 5 units ? ?Please come back for a follow-up appointment in 3-4 months. ?

## 2021-08-18 NOTE — Progress Notes (Signed)
Patient ID: Brian Braun, male   DOB: 01/03/1986, 35 y.o.   MRN: [id number]  ? ?This visit occurred during the SARS-CoV-2 public health emergency.  Safety protocols were in place, including screening questions prior to the visit, additional usage of staff PPE, and extensive cleaning of exam room while observing appropriate contact time as indicated for disinfecting solutions.  ? ?HPI: ?Brian Braun is a 35 y.o.-year-old male, returning for f/u for DM1, dx 1996, uncontrolled, with long-term complications (DR). Last visit 2 months ago.  ? ?Interim hx: ?He denies increased urination, blurry vision, nausea, chest pain. 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 ? ?Reviewed HbA1c levels: ?Lab Results  ?Component Value Date  ? HGBA1C 7.7 (A) 06/18/2021  ? HGBA1C 7.7 (A) 02/12/2021  ? HGBA1C 7.0 (A) 10/09/2020  ? HGBA1C 8.5 (A) 06/05/2020  ? HGBA1C 7.6 (A) 01/29/2020  ? HGBA1C 7.7 (A) 09/27/2019  ? HGBA1C 7.6 (A) 12/11/2018  ? HGBA1C 7.8 (A) 07/13/2018  ? HGBA1C 7.5 (A) 03/13/2018  ? HGBA1C 7.6 (A) 11/10/2017  ? HGBA1C 7.1 07/14/2017  ? HGBA1C 7.4 03/18/2017  ? HGBA1C 6.8 11/15/2016  ? HGBA1C 8.7 07/28/2016  ? HGBA1C 7.2 12/29/2015  ? HGBA1C 6.4 09/23/2015  ? HGBA1C 7.0 06/23/2015  ? HGBA1C 6.2 03/25/2015  ? HGBA1C 7.3 12/24/2014  ? HGBA1C 6.4 09/20/2014  ? ?01/2019: work: HbA1c 7.0% ?01/2018: HbA1c (work): 6.8% (Novolog) ?03/25/2016: HbA1c 6.2% ?10/11/2013: 7.6% ?06/13/2013: 7.3% ? ?He was previously on the Medtronic MiniMed insulin pump up to 2011, but he developed DKA episodes and now he is not interested in restarting the pump. ? ?He is on: ?- Lantus 40-42 >> ... >> Semglee 16 units in am and 22 >> 26 units at night ?- Humalog >> FiAsp - taken after meals >> moved before meals ?8-10  >> 10(-14) units ?Target 120 ?ISF 30 ?This means: ?- 120-150: + 1 unit. ?- 151-180: + 2 units  ?- 181-210: + 3 units  ?- 210-240: + 4 units  ?- >240: + 5 units ? ?Glucometer:  Accu-Chek expert meter >> One Touch Verio ?He continues on Humalog, which is not as efficient for him as NovoLog, but he had a very high co-pay for NovoLog ($600) while for Humalog he only has to pay $25. ? ?Pt checks his sugars >4 times a day with his new CGM: ? ? ? ? ?Previously: ?- am: 120-130 >> 150-200 >> 41, 74-121, 246 >> 150-160s >> 120-160 ?PB crackers ?- 2h after b'fast: n/c >> 211-309, 403 >> 120-130s >> 160-170 ?(7-8 am: b'fast at work) ?- before lunch: 120-180 >> 70-230, 413  >> 160 (PB + jelly) >> 120-160 (~activity) ?- 2h after lunch: n/c >> 84-388 >> 180 >> 170-180 ?(Snack 3p) ?- before dinner: 120-160 >> 180-215 >> 48 >> 140-208 >> 150-200 ?- 2h after dinner: n/c  ?- bedtime: 110-140 >> 200-250 >> 107, 195-471 >> 140-150 >> n/c ?- nighttime: before eating - 140-150s >> n/c ?Lowest sugar was 120 >> 41, 48 >> 70 >> 55 (took too much insulin  - smaller meal) >> 40s;  he has hypoglycemia awareness in the 60s. He has an unexpired glucagon kit at home. ?Highest sugar was 300 >> 250 >> 471 >> 300 x1 >> 250 >> 300s ?He had one DKA admission in 2011. ? ?Pt's meals are: ?- Breakfast: oatmeal or bacon and eggs + cheese biscuit ?- Lunch: sandwich or hamburger ?- Dinner: grilled chicken and   rice; spaghetti; sloppy joes ?- Snacks:PB crackers at 10 am; cheeze it's  ? ?No history of CKD, last BUN/creatinine: ?Lab Results  ?Component Value Date  ? BUN 12 06/18/2021  ? CREATININE 1.33 06/18/2021  ? ?Lab Results  ?Component Value Date  ? GFR 68.68 06/18/2021  ? GFR 59.65 (L) 09/27/2019  ? GFR 54.44 (L) 03/25/2015  ? GFR 78.07 04/09/2014  ? ?No MAU: ?Lab Results  ?Component Value Date  ? MICRALBCREAT 0.8 10/09/2020  ? MICRALBCREAT 1.0 09/27/2019  ? MICRALBCREAT 1.2 03/13/2018  ? MICRALBCREAT 1.3 03/18/2017  ? MICRALBCREAT 2.1 03/29/2016  ? ?He has a history of dyslipidemia but latest lipid panel was at goal: ?Lab Results  ?Component Value Date  ? CHOL 139 06/18/2021  ? HDL 43.70 06/18/2021  ? [place] 69 06/18/2021  ?  TRIG 131.0 06/18/2021  ? CHOLHDL 3 06/18/2021  ? ?Latest TSH was normal: ?Lab Results  ?Component Value Date  ? TSH 1.24 06/18/2021  ? ?- last eye exam was in 2022: + DR, stable, reportedly. Coming up in 08/2021. ? ?- no numbness and tingling in his feet.  Foot exam: 06/2021. ? ?ROS: ?+ see HPI ? ?I reviewed pt's medications, allergies, PMH, social hx, family hx, and changes were documented in the history of present illness. Otherwise, unchanged from my initial visit note. ? ?Past Medical History:  ?Diagnosis Date  ? Diabetes mellitus without complication ([place])   ? Type I   ? ?Past Surgical History:  ?Procedure Laterality Date  ? NECK SURGERY  07/2011  ? ?History  ? ?Social History  ? Marital Status:  married   ?  Spouse Name: N/A  ?  Number of Children: 1  ? ?Occupational History  ?  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 ?Social History Main Topics  ? Smoking status: Never Smoker   ? Smokeless tobacco: Current User  ?  Types: Chew  ? Alcohol Use: 0.0 oz/week  ?  0 Not specified per week  ? Drug Use: No  ? ?Social History Narrative  ? Married  ? 1 stepson  ? ?Current Outpatient Medications  ?Medication Sig Dispense Refill  ? ACCU-CHEK SOFTCLIX LANCETS lancets USE TO TEST BLOOD SUGAR 4 TIMES A DAY 200 each 11  ? Continuous Blood Gluc Sensor (FREESTYLE LIBRE 3 SENSOR) MISC 1 each by Does not apply route every 14 (fourteen) days. 6 each 3  ? Glucagon 3 MG/DOSE POWD Place 3 mg into the nose once as needed for up to 1 dose. 1 each 11  ? insulin aspart (FIASP FLEXTOUCH) 100 UNIT/ML FlexTouch Pen INJECT UNDER SKIN 8-14 UNITS BEFORE MEALS 30 mL 1  ? Insulin Pen Needle (CLICKFINE PEN NEEDLES) 32G X 4 MM MISC Use 4x a day 400 each 3  ? ONETOUCH VERIO test strip USE TO CHECK BLOOD SUGAR 4 TIMES A DAY 400 strip 12  ? SEMGLEE, YFGN, 100 UNIT/ML Pen INJECT 42 UNITS INTO THE SKIN AT BEDTIME 45 mL 3  ? ?No current  facility-administered medications for this visit.  ? ?Allergies  ?Allergen Reactions  ? Basaglar [place] [Insulin Glargine] Other (See Comments)  ?  Ineffective  ? Humalog [Insulin Lispro] Other (See Comments)  ?  Ineffective  ? ?Family history: None ? ?PE: ?BP 128/82 (BP Location: Left Arm, Patient Position: Sitting, Cuff Size: Normal)   Pulse 81   Ht 5' 5" (1.651 m)  Wt 166 lb (75.3 kg)   SpO2 99%   BMI 27.62 kg/m?   ?Wt Readings from Last 3 Encounters:  ?08/18/21 166 lb (75.3 kg)  ?06/18/21 161 lb (73 kg)  ?02/12/21 162 lb 12.8 oz (73.8 kg)  ? ?Constitutional: overweight, in NAD ?Eyes: PERRLA, EOMI, no exophthalmos ?ENT: moist mucous membranes, no thyromegaly, no cervical lymphadenopathy ?Cardiovascular: tachycardia, RR, No MRG ?Respiratory: CTA B ?Musculoskeletal: no deformities, strength intact in all 4 ?Skin: moist, warm, no rashes ?Neurological: no tremor with outstretched hands, DTR normal in all 4 ? ?ASSESSMENT: ?1. DM1, uncontrolled, with complications (background DR) ? ?-He keeps his blood sugars higher during the summer on purpose, as he is more active, mowing ?-Per  records from Dr. Solum, patient's diabetes was never very uncontrolled, his range is 7.2-7.6%. I suspect that he still has some insulin production from his pancreas.  ?-He is not interested in an insulin pump. ? ?2. Dyslipidemia ? ?PLAN:  ?1. Patient with longstanding, uncontrolled, type 1 diabetes, with increased variability in his blood sugars and higher HbA1c levels after he started a less active job.  At last visit, HbA1c was stable, above target, at 7.7%.  At that time, sugars are higher than target at all times of the day, with values in the 160s and 170s before meals, possibly also due to snacks.  Before dinner, he had occasional blood sugars higher than this.  He was afraid to bolus Fiasp at the recommended doses for fear of dropping too low when he was working.  At last visit, he had a freestyle libre CGM with him and I  attached it to his left upper arm.  I showed him how to use the application to receive blood sugar information.  We did not change his regimen at that time hoping that the CGM attachment would trigger behavioral modification and s

## 2021-09-17 DIAGNOSIS — E103293 Type 1 diabetes mellitus with mild nonproliferative diabetic retinopathy without macular edema, bilateral: Secondary | ICD-10-CM | POA: Diagnosis not present

## 2021-09-17 DIAGNOSIS — H5213 Myopia, bilateral: Secondary | ICD-10-CM | POA: Diagnosis not present

## 2021-10-07 ENCOUNTER — Encounter: Payer: Self-pay | Admitting: Internal Medicine

## 2021-11-01 ENCOUNTER — Ambulatory Visit
Admission: RE | Admit: 2021-11-01 | Discharge: 2021-11-01 | Disposition: A | Discharge status: Home / Self Care | Payer: BC Managed Care – PPO | Source: Ambulatory Visit | Attending: Emergency Medicine | Admitting: Emergency Medicine

## 2021-11-01 ENCOUNTER — Ambulatory Visit (INDEPENDENT_AMBULATORY_CARE_PROVIDER_SITE_OTHER): Payer: BC Managed Care – PPO

## 2021-11-01 VITALS — BP 123/93 | HR 69 | Temp 98.4°F | Resp 14 | Ht 65.0 in | Wt 166.0 lb

## 2021-11-01 DIAGNOSIS — R109 Unspecified abdominal pain: Secondary | ICD-10-CM | POA: Diagnosis not present

## 2021-11-01 DIAGNOSIS — R319 Hematuria, unspecified: Secondary | ICD-10-CM | POA: Diagnosis not present

## 2021-11-01 DIAGNOSIS — N200 Calculus of kidney: Secondary | ICD-10-CM | POA: Diagnosis not present

## 2021-11-01 LAB — URINALYSIS, ROUTINE W REFLEX MICROSCOPIC
Bilirubin Urine: NEGATIVE
Glucose, UA: NEGATIVE mg/dL
Ketones, ur: NEGATIVE mg/dL
Leukocytes,Ua: NEGATIVE
Nitrite: NEGATIVE
Specific Gravity, Urine: 1.025 (ref 1.005–1.030)
pH: 5.5 (ref 5.0–8.0)

## 2021-11-01 LAB — BASIC METABOLIC PANEL
Anion gap: 5 (ref 5–15)
BUN: 14 mg/dL (ref 6–20)
CO2: 28 mmol/L (ref 22–32)
Calcium: 8.8 mg/dL — ABNORMAL LOW (ref 8.9–10.3)
Chloride: 102 mmol/L (ref 98–111)
Creatinine, Ser: 1.2 mg/dL (ref 0.61–1.24)
GFR, Estimated: >60 mL/min (ref >60)
Glucose, Bld: 184 mg/dL — ABNORMAL HIGH (ref 70–99)
Potassium: 4.3 mmol/L (ref 3.5–5.1)
Sodium: 135 mmol/L (ref 135–145)

## 2021-11-01 LAB — URINALYSIS, MICROSCOPIC (REFLEX)
RBC / HPF: >50 RBC/hpf (ref 0–5)
Squamous Epithelial / HPF: NONE SEEN (ref 0–5)

## 2021-11-01 MED ORDER — TAMSULOSIN HCL 0.4 MG PO CAPS: 0.4000 mg | ORAL_CAPSULE | Freq: Every day | ORAL | 0 refills | Status: AC

## 2021-11-01 NOTE — Discharge Instructions (Addendum)
Your x-ray today showed a 4 mm stone in your right mid ureter.  This is most likely was causing your pain.  The x-rays also noted multiple stones in your kidneys.  Increase oral fluid intake such increases urine production and help flush the stone along.  Take the tamsulosin at night to help dilate up your ureter and make it easier for the stone to pass.  Strain all of your urine to see if you can collect the stone when you pass it.  You can take this with you to urology for evaluation.  I have referred you to urology and they will call you for an appointment.  You may continue to use over-the-counter Tylenol as needed for the pain since it has been helping you.

## 2021-11-01 NOTE — ED Triage Notes (Signed)
Patient states that he started having right sided back pain that started yesterday evening.  Patient states that he is not sure if he pulled a muscle.  Patient denies any urinary symptoms.  Patient has taken Tylenol at 5am this morning.

## 2021-11-01 NOTE — ED Provider Notes (Signed)
[facility]    CSN: [id number] Arrival date & time: 11/01/21  1039      History   Chief Complaint Chief Complaint  Patient presents with   Back Pain    Right sided    HPI Brian Braun is a 36 y.o. male.   HPI  36 year old male here for right flank pain.  Patient reports that the pain came on yesterday afternoon.  He states that the only activity that he engaged in was he and his brother picking the child up but he does not member feeling any pain afterwards.  He states the pain came on later and it was dull in nature.  It did respond to Tylenol last night and then again this morning.  He is also applying heating pad which has helped.  He denies any painful urination, urinary urgency or frequency, blood in his urine, nausea, vomiting, or sweating.  Patient does have a history of renal stones and abnormal renal function.  He is a type I diabetic with diabetic retinopathy.  Past Medical History:  Diagnosis Date   Diabetes mellitus without complication (HCC)    Type I     Patient Active Problem List   Diagnosis Date Noted   Encounter for commercial driving license (CDL) exam 60/01/9322   Dyslipidemia 11/10/2017   Abnormal kidney function 03/18/2017   Type 1 diabetes mellitus with background diabetic retinopathy (HCC) 06/23/2015    Past Surgical History:  Procedure Laterality Date   NECK SURGERY  07/2011       Home Medications    Prior to Admission medications   Medication Sig Start Date End Date Taking? Authorizing Provider  tamsulosin (FLOMAX) 0.4 MG CAPS capsule Take 1 capsule (0.4 mg total) by mouth daily. 11/01/21  Yes Becky Augusta, NP  ACCU-CHEK SOFTCLIX LANCETS lancets USE TO TEST BLOOD SUGAR 4 TIMES A DAY 11/15/16   Carlus Pavlov, MD  Continuous Blood Gluc Sensor (FREESTYLE LIBRE 3 SENSOR) MISC 1 each by Does not apply route every 14 (fourteen) days. 06/19/21   Carlus Pavlov, MD  Glucagon 3 MG/DOSE POWD Place 3 mg into the nose once as  needed for up to 1 dose. 02/12/21   Carlus Pavlov, MD  insulin aspart (FIASP FLEXTOUCH) 100 UNIT/ML FlexTouch Pen INJECT UNDER SKIN 8-14 UNITS BEFORE MEALS 06/19/21   Carlus Pavlov, MD  insulin glargine-yfgn (SEMGLEE, YFGN,) 100 UNIT/ML Pen INJECT up to 35 UNITS INTO THE SKIN AS ADVISED 08/18/21   Carlus Pavlov, MD  Insulin Pen Needle (CLICKFINE PEN NEEDLES) 32G X 4 MM MISC Use 4x a day 01/15/19   Carlus Pavlov, MD  [facility] VERIO test strip USE TO CHECK BLOOD SUGAR 4 TIMES A DAY 09/06/19   Carlus Pavlov, MD    Family History History reviewed. No pertinent family history.  Social History Social History   Tobacco Use   Smoking status: Never   Smokeless tobacco: Current    Types: Chew  Vaping Use   Vaping Use: Never used  Substance Use Topics   Alcohol use: Yes    Alcohol/week: 0.0 standard drinks of alcohol   Drug use: No     Allergies   Basaglar kwikpen [insulin glargine] and Humalog [insulin lispro]   Review of Systems Review of Systems  Constitutional:  Negative for diaphoresis and fever.  Gastrointestinal:  Negative for nausea and vomiting.  Genitourinary:  Positive for flank pain. Negative for dysuria, frequency, hematuria and urgency.  Musculoskeletal:  Negative for back pain.  Hematological: Negative.  Psychiatric/Behavioral: Negative.       Physical Exam Triage Vital Signs ED Triage Vitals  Enc Vitals Group     BP      Pulse      Resp      Temp      Temp src      SpO2      Weight      Height      Head Circumference      Peak Flow      Pain Score      Pain Loc      Pain Edu?      Excl. in [place]?    No data found.  Updated Vital Signs BP (!) 123/93 (BP Location: Left Arm)   Pulse 69   Temp 98.4 F (36.9 C) (Oral)   Resp 14   Ht 5\' 5"  (1.651 m)   Wt 166 lb 0.1 oz (75.3 kg)   SpO2 96%   BMI 27.62 kg/m   Visual Acuity Right Eye Distance:   Left Eye Distance:   Bilateral Distance:    Right Eye Near:   Left Eye Near:     Bilateral Near:     Physical Exam Vitals and nursing note reviewed.  Constitutional:      Appearance: Normal appearance. He is not ill-appearing.  HENT:     Head: Normocephalic and atraumatic.  Cardiovascular:     Rate and Rhythm: Normal rate and regular rhythm.     Pulses: Normal pulses.     Heart sounds: Normal heart sounds. No murmur heard.    No friction rub. No gallop.  Pulmonary:     Effort: Pulmonary effort is normal.     Breath sounds: Normal breath sounds. No wheezing, rhonchi or rales.  Abdominal:     Tenderness: There is no right CVA tenderness or left CVA tenderness.  Musculoskeletal:        General: Tenderness present. No swelling, deformity or signs of injury.  Skin:    General: Skin is warm and dry.     Capillary Refill: Capillary refill takes less than 2 seconds.     Findings: No erythema or rash.  Neurological:     General: No focal deficit present.     Mental Status: He is alert and oriented to person, place, and time.  Psychiatric:        Mood and Affect: Mood normal.        Behavior: Behavior normal.        Thought Content: Thought content normal.        Judgment: Judgment normal.      UC Treatments / Results  Labs (all labs ordered are listed, but only abnormal results are displayed) Labs Reviewed  URINALYSIS, ROUTINE W REFLEX MICROSCOPIC - Abnormal; Notable for the following components:      Result Value   Color, Urine AMBER (*)    APPearance CLOUDY (*)    Hgb urine dipstick MODERATE (*)    Protein, ur TRACE (*)    All other components within normal limits  URINALYSIS, MICROSCOPIC (REFLEX) - Abnormal; Notable for the following components:   Bacteria, UA RARE (*)    All other components within normal limits  BASIC METABOLIC PANEL - Abnormal; Notable for the following components:   Glucose, Bld 184 (*)    Calcium 8.8 (*)    All other components within normal limits    EKG   Radiology DG Abdomen 1 View  Result Date:  11/01/2021 CLINICAL  DATA:  RIGHT flank pain and hematuria. History of urinary calculi. EXAM: ABDOMEN - 1 VIEW COMPARISON:  01/11/2017 CT FINDINGS: A 4 mm calcification to the RIGHT of L4 is noted and likely representing a mid RIGHT ureteral calculus. Calcifications overlying the LOWER RIGHT renal shadow and mid and LOWER LEFT renal shadow are compatible with nephrolithiasis. Bowel gas pattern is unremarkable. IMPRESSION: 1. 4 mm calcification to the RIGHT of L4 likely representing a mid RIGHT ureteral calculus. 2. Bilateral nephrolithiasis. Electronically Signed   By: Margarette Canada M.D.   On: 11/01/2021 12:00    Procedures Procedures (including critical care time)  Medications Ordered in UC Medications - No data to display  Initial Impression / Assessment and Plan / UC Course  I have reviewed the triage vital signs and the nursing notes.  Pertinent labs & imaging results that were available during my care of the patient were reviewed by me and considered in my medical decision making (see chart for details).  Patient is a pleasant, nontoxic-appearing 36 year old male here for evaluation of right side/flank pain that started yesterday afternoon.  The pain has been episodic and does respond to Tylenol.  Patient reports it resolves completely when he takes Tylenol.  He is also been applying moist heat.  Pain does not really increase with movement.  Patient has no associated urinary symptoms or GI symptoms.  Physical exam reveals a benign cardiopulmonary exam with S1-S2 heart sounds with regular rate and rhythm and lung sounds that are clear to auscultation all fields.  No CVA tenderness on exam.  No midline spinous tenderness or thoracic or lower lumbar paraspinous tenderness.  Patient does have some tenderness when palpating the muscle on the lower anterior right flank.  No spasm or trigger points identified.  Given patient's history of renal stones I will check a urinalysis.  I suspect is musculoskeletal  in nature and has Tylenol and is resolving his pain.  If his urinalysis is negative I will discharge him home with musculoskeletal flank pain and have him continue Tylenol and moist heat applications.  Urinalysis shows amber color with cloudy appearance, moderate hemoglobin, and trace protein.  Negative for leukocyte esterase, nitrates, or ketones.  Reflex microscopy shows greater than 50 RBCs and rare bacteria.  I will order a KUB to look for the presence of a possible renal stone and check a BMP to ensure the patient's renal function is normal.  Radiology impression of KUB states there is a 4 mm calcification to the right of L4 likely representing a mid right ureteral calculus.  Patient also has bilateral nephrolithiasis.  BMP shows a BUN of 14 and creatinine of 1.20.  Electrolytes are unremarkable.  Discharge patient home with urine strainer to strain his urine and Flomax to help pass the kidney stone.  I will also refer him to urology for evaluation should his pain not improve or he not passed the stone.   Final Clinical Impressions(s) / UC Diagnoses   Final diagnoses:  Kidney stone     Discharge Instructions      Your x-ray today showed a 4 mm stone in your right mid ureter.  This is most likely was causing your pain.  The x-rays also noted multiple stones in your kidneys.  Increase oral fluid intake such increases urine production and help flush the stone along.  Take the tamsulosin at night to help dilate up your ureter and make it easier for the stone to pass.  Strain all of your urine  to see if you can collect the stone when you pass it.  You can take this with you to urology for evaluation.  I have referred you to urology and they will call you for an appointment.  You may continue to use over-the-counter Tylenol as needed for the pain since it has been helping you.     ED Prescriptions     Medication Sig Dispense Auth. Provider   tamsulosin (FLOMAX) 0.4 MG CAPS  capsule Take 1 capsule (0.4 mg total) by mouth daily. 30 capsule Becky Augusta, NP      I have reviewed the PDMP during this encounter.   Becky Augusta, NP 11/01/21 1243

## 2021-11-10 ENCOUNTER — Ambulatory Visit: Payer: Self-pay | Admitting: Urology

## 2021-12-29 ENCOUNTER — Encounter: Payer: Self-pay | Admitting: Internal Medicine

## 2021-12-29 ENCOUNTER — Ambulatory Visit: Payer: BC Managed Care – PPO | Admitting: Internal Medicine

## 2021-12-29 VITALS — BP 128/88 | HR 86 | Ht 65.0 in | Wt 174.8 lb

## 2021-12-29 DIAGNOSIS — E103299 Type 1 diabetes mellitus with mild nonproliferative diabetic retinopathy without macular edema, unspecified eye: Secondary | ICD-10-CM | POA: Diagnosis not present

## 2021-12-29 DIAGNOSIS — E785 Hyperlipidemia, unspecified: Secondary | ICD-10-CM | POA: Diagnosis not present

## 2021-12-29 LAB — POCT GLYCOSYLATED HEMOGLOBIN (HGB A1C): Hemoglobin A1C: 6.6 % — AB (ref 4.0–5.6)

## 2021-12-29 NOTE — Progress Notes (Signed)
Patient ID: Brian Braun, male   DOB: [birth year], 36 y.o.   MRN: [id number]   HPI: Brian Braun is a 36 y.o.-year-old male, returning for f/u for DM1, dx 1996, uncontrolled, with long-term complications (DR). Last visit 4 months ago.   Interim hx: He denies increased urination, blurry vision, nausea, chest pain. 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 She was in the emergency room 11/01/2021 with a kidney stone >> he passed it.  On imaging, it appears that he had bilateral. nephrolithiasis. His girlfriend gave birth in 10/2021 >> healthy baby girl: Jodi Mourning.  Reviewed HbA1c levels: Lab Results  Component Value Date   HGBA1C 6.7 (A) 08/18/2021   HGBA1C 7.7 (A) 06/18/2021   HGBA1C 7.7 (A) 02/12/2021   HGBA1C 7.0 (A) 10/09/2020   HGBA1C 8.5 (A) 06/05/2020   HGBA1C 7.6 (A) 01/29/2020   HGBA1C 7.7 (A) 09/27/2019   HGBA1C 7.6 (A) 12/11/2018   HGBA1C 7.8 (A) 07/13/2018   HGBA1C 7.5 (A) 03/13/2018   HGBA1C 7.6 (A) 11/10/2017   HGBA1C 7.1 07/14/2017   HGBA1C 7.4 03/18/2017   HGBA1C 6.8 11/15/2016   HGBA1C 8.7 07/28/2016   HGBA1C 7.2 12/29/2015   HGBA1C 6.4 09/23/2015   HGBA1C 7.0 06/23/2015   HGBA1C 6.2 03/25/2015   HGBA1C 7.3 12/24/2014   01/2019: work: HbA1c 7.0% 01/2018: HbA1c (work): 6.8% (Novolog) 03/25/2016: HbA1c 6.2% 10/11/2013: 7.6% 06/13/2013: 7.3%  He was previously on the Medtronic MiniMed insulin pump up to 2011, but he developed DKA episodes and now he is not interested in restarting the pump.  He is on: - Lantus 40-42 >> ... >> Semglee 16 units in am and 22 >> 26 units at night - Humalog >> FiAsp - taken after meals >> moved before meals 8-10  >> 10(-14) units Target 120 ISF 30 This means: - 120-150: + 1 unit. - 151-180: + 2 units  - 181-210: + 3 units  - 210-240: + 4 units  - >240: + 5 units  Glucometer: Accu-Chek expert meter >> One Touch Verio He continues on Humalog, which is not as  efficient for him as NovoLog, but he had a very high co-pay for NovoLog ($600) while for Humalog he only has to pay $25.  Pt checks his sugars >4 times a day with his new CGM:  Previously:     Previously: - am: 120-130 >> 150-200 >> 41, 74-121, 246 >> 150-160s >> 120-160 PB crackers - 2h after b'fast: n/c >> 211-309, 403 >> 120-130s >> 160-170 (7-8 am: b'fast at work) - before lunch: 120-180 >> 70-230, 413  >> 160 (PB + jelly) >> 120-160 (~activity) - 2h after lunch: n/c >> 84-388 >> 180 >> 170-180 (Snack 3p) - before dinner: 120-160 >> 180-215 >> 48 >> 140-208 >> 150-200 - 2h after dinner: n/c  - bedtime: 110-140 >> 200-250 >> 107, 195-471 >> 140-150 >> n/c - nighttime: before eating - 140-150s >> n/c  Lowest sugar was 41, 48 >> 70 >> 55 (took too much insulin  - smaller meal) >> 40s;  he has hypoglycemia awareness in the 60s. He has an unexpired glucagon kit at home. Highest sugar was 471 >> 300 x1 >> 250 >> 300s He had one DKA admission in 2011.  Pt's meals are: - Breakfast: oatmeal or bacon and eggs + cheese biscuit - Lunch: sandwich or hamburger - Dinner: grilled chicken and rice; spaghetti; sloppy joes - Snacks:PB crackers at 10 am; cheeze  it's   No history of CKD, last BUN/creatinine: Lab Results  Component Value Date   BUN 14 11/01/2021   CREATININE 1.20 11/01/2021   Lab Results  Component Value Date   GFR 68.68 06/18/2021   GFR 59.65 (L) 09/27/2019   GFR 54.44 (L) 03/25/2015   GFR 78.07 04/09/2014   No MAU: Lab Results  Component Value Date   MICRALBCREAT 0.8 10/09/2020   MICRALBCREAT 1.0 09/27/2019   MICRALBCREAT 1.2 03/13/2018   MICRALBCREAT 1.3 03/18/2017   MICRALBCREAT 2.1 03/29/2016   He has a history of dyslipidemia but latest lipid panel was at goal: Lab Results  Component Value Date   CHOL 139 06/18/2021   HDL 43.70 06/18/2021   LDLCALC 69 06/18/2021   TRIG 131.0 06/18/2021   CHOLHDL 3 06/18/2021   Latest TSH was normal: Lab Results   Component Value Date   TSH 1.24 06/18/2021   - last eye exam was in 08/2021: + DR, stable, reportedly.   - no numbness and tingling in his feet.  Foot exam: 06/2021.  ROS: + see HPI  I reviewed pt's medications, allergies, PMH, social hx, family hx, and changes were documented in the history of present illness. Otherwise, unchanged from my initial visit note.  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 6 each 3   Glucagon 3 MG/DOSE POWD Place 3 mg into the nose once as needed for up to 1 dose. 1 each 11   insulin aspart (FIASP FLEXTOUCH) 100 UNIT/ML FlexTouch Pen INJECT UNDER SKIN 8-14 UNITS BEFORE MEALS 30 mL 1   insulin glargine-yfgn (SEMGLEE, YFGN,) 100 UNIT/ML Pen INJECT up to 35 UNITS INTO THE SKIN AS ADVISED 45 mL 3   Insulin Pen Needle (CLICKFINE PEN NEEDLES) 32G X 4 MM MISC Use 4x a day 400 each 3   ONETOUCH VERIO test strip USE TO CHECK BLOOD SUGAR 4 TIMES A DAY 400 strip 12   tamsulosin (FLOMAX) 0.4 MG CAPS capsule Take 1 capsule (0.4 mg total) by mouth daily. 30 capsule 0    No current facility-administered medications for this visit.   Allergies  Allergen Reactions   Basaglar Kwikpen [Insulin Glargine] Other (See Comments)    Ineffective   Humalog [Insulin Lispro] Other (See Comments)    Ineffective   Family history: None  PE: BP 128/88 (BP Location: Right Arm, Patient Position: Sitting, Cuff Size: Normal)  Pulse 86   Ht '5\' 5"'  (1.651 m)   Wt 174 lb 12.8 oz (79.3 kg)   SpO2 99%   BMI 29.09 kg/m   Wt Readings from Last 3 Encounters:  12/29/21 174 lb 12.8 oz (79.3 kg)  11/01/21 166 lb 0.1 oz (75.3 kg)  08/18/21 166 lb (75.3 kg)   Constitutional: overweight, in NAD Eyes: no exophthalmos ENT: moist mucous membranes, no masses palpated in neck, no cervical lymphadenopathy Cardiovascular: RRR, No MRG Respiratory: CTA B Musculoskeletal: no deformities Skin: moist, warm, no rashes Neurological: no tremor with outstretched hands  ASSESSMENT: 1. DM1, uncontrolled, with complications (background DR)  -He keeps his blood sugars higher during the summer on purpose, as he is more active, mowing -Per  records from Dr. Gabriel Carina, patient's diabetes was never very uncontrolled, his range is 7.2-7.6%. I suspect that he still has some insulin production from his pancreas.  -He is not interested in an insulin pump.  2. Dyslipidemia  PLAN:  1. Patient with longstanding, uncontrolled, type 1 diabetes, with increased variability in his blood sugars, but improved CBGs at last visit after starting a CGM.  At that time, HbA1c was the best he has in 6 years, at 6.7%.  As he was having occasional low blood sugars between meals, I advised him to decrease the Semglee.  His sugars were increasing drastically after meals and we discussed about trying to move the Fiasp up to 10 or 15 minutes before each meal to see if this allows him to do better on his meals.  We did not change the insulin doses at that time, but I did advise him that if moving the Fiasp was not helping, to  try to use higher doses of Fiasp. CGM interpretation: -At today's visit, we reviewed his CGM downloads: It appears that 50% of values are in target range (goal >70%), while 47% are higher than 180 (goal <25%), and 3% are lower than 70 (goal <4%).  The calculated average blood sugar is 177.  The projected HbA1c for the next 3 months (GMI) is 7.5%. -Reviewing the CGM trends, it appears that his blood sugars are fluctuating around the upper limit of the target range.  The predicted HbA1c is higher than before, however, what stands out in his CGM report is the sawtooth pattern of blood sugars fluctuating throughout the day:  -We discussed at last visit about moving Fiasp earlier and increasing the dose, and he did so, but this did not improve the blood sugars.  Upon questioning, he is correcting the high blood sugars after the meals as instructed by the sliding scale.  At this visit I advised him to correct it with less insulin and I changed his sliding scale parameters.  He is using his stomach and we discussed about trying the leg. -However, ultimately, I did suggest to retry an insulin pump.  He tried a Medtronic pump before, and he would not want to go back on this, but I explained that this was more than 10 years ago and insulin pump came a long way and they are now integrated with the CGM.  I actually suggested the t:slim or an OmniPod.  Given brochures.  I advised him to let me know if he wants me to refer him to the diabetes educator for further information and rebound training. - I advised him to:  Patient Instructions  Please continue: - Semglee 16 units in am and 26 units at night - FiAsp: inject in leg 10-14 units  before meals + sliding scale Target 150 ISF 30 This means: - 150-180: + 1 units  - 181-210: + 2 units  - >210: + 3 units   Think about an insulin pump - Omnipod vs. T:slim.  Please come back for a follow-up appointment in 3-4 months.  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  Dyslipidemia -Reviewed the latest lipid panel from 06/2021: Fractions at goal: Lab Results  Component Value Date   CHOL 139 06/18/2021   HDL 43.70 06/18/2021   LDLCALC 69 06/18/2021   TRIG 131.0 06/18/2021   CHOLHDL 3 06/18/2021  -He is not on a statin  Philemon Kingdom, MD PhD [facility] Endocrinology

## 2021-12-29 NOTE — Patient Instructions (Addendum)
Please continue: - Semglee 16 units in am and 26 units at night - FiAsp: inject in leg 10-14 units before meals + sliding scale Target 150 ISF 30 This means: - 150-180: + 1 units  - 181-210: + 2 units  - >210: + 3 units   Think about an insulin pump - Omnipod vs. T:slim.  Please come back for a follow-up appointment in 3-4 months.

## 2022-03-09 ENCOUNTER — Other Ambulatory Visit: Payer: Self-pay | Admitting: Internal Medicine

## 2022-03-09 DIAGNOSIS — E103299 Type 1 diabetes mellitus with mild nonproliferative diabetic retinopathy without macular edema, unspecified eye: Secondary | ICD-10-CM

## 2022-03-09 MED ORDER — CLICKFINE PEN NEEDLES 32G X 4 MM MISC: 3 refills | Status: DC

## 2022-05-04 ENCOUNTER — Ambulatory Visit: Payer: BC Managed Care – PPO | Admitting: Internal Medicine

## 2022-05-04 ENCOUNTER — Encounter: Payer: Self-pay | Admitting: Internal Medicine

## 2022-05-04 VITALS — BP 128/78 | HR 88 | Ht 65.0 in | Wt 168.2 lb

## 2022-05-04 DIAGNOSIS — E785 Hyperlipidemia, unspecified: Secondary | ICD-10-CM

## 2022-05-04 DIAGNOSIS — E103299 Type 1 diabetes mellitus with mild nonproliferative diabetic retinopathy without macular edema, unspecified eye: Secondary | ICD-10-CM

## 2022-05-04 LAB — POCT GLYCOSYLATED HEMOGLOBIN (HGB A1C): Hemoglobin A1C: 7 % — AB (ref 4.0–5.6)

## 2022-05-04 NOTE — Progress Notes (Signed)
Patient ID: Brian Braun, male   DOB: 01/26/1986, 37 y.o.   MRN: [id number]   HPI: Brian Braun is a 37 y.o.-year-old male, returning for f/u for DM1, dx 1996, uncontrolled, with long-term complications (DR). Last visit 4 months ago.   Interim hx: He denies increased urination, blurry vision, nausea, chest pain. 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  However, they are less busy during the winter. He switched to longer pen needles ~ 2 weeks ago >> feels sugars are better after this.  Reviewed HbA1c levels: Lab Results  Component Value Date   HGBA1C 6.6 (A) 12/29/2021   HGBA1C 6.7 (A) 08/18/2021   HGBA1C 7.7 (A) 06/18/2021   HGBA1C 7.7 (A) 02/12/2021   HGBA1C 7.0 (A) 10/09/2020   HGBA1C 8.5 (A) 06/05/2020   HGBA1C 7.6 (A) 01/29/2020   HGBA1C 7.7 (A) 09/27/2019   HGBA1C 7.6 (A) 12/11/2018   HGBA1C 7.8 (A) 07/13/2018   HGBA1C 7.5 (A) 03/13/2018   HGBA1C 7.6 (A) 11/10/2017   HGBA1C 7.1 07/14/2017   HGBA1C 7.4 03/18/2017   HGBA1C 6.8 11/15/2016   HGBA1C 8.7 07/28/2016   HGBA1C 7.2 12/29/2015   HGBA1C 6.4 09/23/2015   HGBA1C 7.0 06/23/2015   HGBA1C 6.2 03/25/2015   01/2019: work: HbA1c 7.0% 01/2018: HbA1c (work): 6.8% (Novolog) 03/25/2016: HbA1c 6.2% 10/11/2013: 7.6% 06/13/2013: 7.3%  He was previously on the Medtronic MiniMed insulin pump up to 2011, but he developed DKA episodes and now he is not interested in restarting the pump.  He is on: - Lantus 40-42 >> ... >> Semglee 16 units in am and 26 units at night - Humalog >> FiAsp: inject in leg 10-14 units before meals + sliding scale Target 150 ISF 30 This means: - 150-180: + 1 units  - 181-210: + 2 units  - >210: + 3 units   Glucometer: Accu-Chek expert meter >> One Touch Verio He continues on Humalog, which is not as efficient for him as NovoLog, but he had a very high co-pay for NovoLog ($600) while for Humalog he only has to pay $25.  Pt checks  his sugars >4 times a day with his CGM:  Previously:  Previously:   Lowest sugar was 55 (took too much insulin  - smaller meal) >> 40s;  he has hypoglycemia awareness in the 60s. He has an unexpired glucagon kit at home. Highest sugar was 471 >> 300 x1 >> 250 >> 300s He had one DKA admission in 2011.  Pt's meals are: - Breakfast: oatmeal or bacon and eggs + cheese biscuit - Lunch: sandwich or hamburger - Dinner: grilled chicken and rice; spaghetti; sloppy joes - Snacks:PB crackers at 10 am; cheeze it's   No history of CKD, last BUN/creatinine: Lab Results  Component Value Date   BUN 14 11/01/2021   CREATININE 1.20 11/01/2021   Lab Results  Component Value Date   GFR 68.68 06/18/2021   GFR 59.65 (L) 09/27/2019   GFR 54.44 (L) 03/25/2015   GFR 78.07 04/09/2014   No MAU: Lab Results  Component Value Date   MICRALBCREAT 0.8 10/09/2020   MICRALBCREAT 1.0 09/27/2019   MICRALBCREAT 1.2 03/13/2018   MICRALBCREAT 1.3 03/18/2017   MICRALBCREAT 2.1 03/29/2016   He has a history of dyslipidemia but latest lipid panel was at goal: Lab Results  Component Value Date   CHOL 139 06/18/2021   HDL 43.70 06/18/2021   LDLCALC 69 06/18/2021   TRIG 131.0 06/18/2021  CHOLHDL 3 06/18/2021   Latest TSH was normal: Lab Results  Component Value Date   TSH 1.24 06/18/2021   - last eye exam was in 08/2021: + DR, stable, reportedly.   - no numbness and tingling in his feet.  Foot exam: 06/2021.  ROS: + see HPI  I reviewed pt's medications, allergies, PMH, social hx, family hx, and changes were documented in the history of present illness. Otherwise, unchanged from my initial visit note.  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 6 each 3   Glucagon 3 MG/DOSE POWD Place 3 mg into the nose once as needed for up to 1 dose. 1 each 11   insulin aspart (FIASP FLEXTOUCH) 100 UNIT/ML FlexTouch Pen INJECT UNDER SKIN 8-14 UNITS BEFORE MEALS 15 mL 2   insulin glargine-yfgn (SEMGLEE, YFGN,) 100 UNIT/ML Pen INJECT up to 35 UNITS INTO THE SKIN AS ADVISED 45 mL 3   Insulin Pen Needle (CLICKFINE PEN NEEDLES) 32G X 4 MM MISC Use 4x a day 400 each 3   ONETOUCH VERIO test strip USE TO CHECK BLOOD SUGAR 4 TIMES A DAY 400 strip 12   tamsulosin (FLOMAX) 0.4 MG CAPS capsule Take 1 capsule (0.4 mg total) by mouth daily. 30 capsule 0   No current facility-administered medications for this visit.   Allergies  Allergen Reactions   Basaglar Kwikpen [Insulin Glargine] Other (See Comments)    Ineffective   Humalog [Insulin Lispro] Other (See Comments)    Ineffective   Family history: None  PE: BP 128/78 (BP Location: Left Arm, Patient Position: Sitting, Cuff Size: Normal)   Pulse 88   Ht 5\' 5"  (1.651 m)   Wt 168 lb 3.2 oz (76.3 kg)   SpO2 99%   BMI 27.99 kg/m   Wt Readings from Last 3 Encounters:  05/04/22 168 lb 3.2 oz (76.3 kg)  12/29/21 174 lb 12.8 oz (79.3 kg)  11/01/21 166 lb 0.1 oz (75.3 kg)   Constitutional: overweight, in NAD Eyes: no exophthalmos ENT: no masses palpated in  neck, no cervical lymphadenopathy Cardiovascular: RRR, No MRG Respiratory: CTA B Musculoskeletal: no deformities Skin:  no rashes Neurological: no tremor with outstretched hands Diabetic Foot Exam - Simple   Simple Foot Form Diabetic Foot exam was performed with the following findings: Yes 05/04/2022  4:37 PM  Visual Inspection No deformities, no ulcerations, no other skin breakdown bilaterally:  Yes Sensation Testing Intact to touch and monofilament testing bilaterally: Yes Pulse Check Posterior Tibialis and Dorsalis pulse intact bilaterally: Yes Comments + onychodystrophy R hallux    ASSESSMENT: 1. DM1, uncontrolled, with complications (background DR)  -He keeps his blood sugars higher during the summer on purpose, as he is more active, mowing -Per  records from Dr. Gabriel Carina, patient's diabetes was never very uncontrolled, his range is 7.2-7.6%. I suspect that he still has some insulin production from his pancreas.  -He is not interested in an insulin pump.  2. Dyslipidemia  PLAN:  1. Patient with longstanding, uncontrolled, type 1 diabetes, with increased variability in blood sugars, on a basal/bolus insulin regimen.  At last visit, sugars were slightly better and HbA1c was also improved, 6.6%.  However, reviewing individual CGM tracings, sugars were fluctuating with a sawtooth pattern throughout the day.  I strongly suggested insulin pump and we went over different pumps available on the market and given brochures. -he tells me that he does not think that he is not able to afford an insulin pump, but will call his insurance and see how much this would cost him.  Of note, he had a Medtronic insulin pump before and would not want to go back to this.  I suggested the t:slim or an OmniPod pump. CGM interpretation: -At today's visit, we reviewed his CGM downloads: It appears that 50% of values are in target range (goal >70%), while 47% are higher than 180 (goal <25%), and 3% are lower than  70 (goal <4%).  The calculated average blood sugar is 175.  The projected HbA1c for the next 3 months ([facility]MI) is 7.5%. -Reviewing the CGM trends, sugars are usually increasing overnight and then again after every meal, despite bolusing Fiasp consistently 15 minutes before meal (he tried injecting before the meal blood sugars were even higher).  He does tell me that in the last 2 weeks he started to use longer pen needles and he now injects on the sides of his abdomen towards the back and he feels that this is working better for him.  However, sugars still increased after each meal so we discussed about the possibility of moving Fiasp even earlier.  Since the sugars are increasing overnight I advised him to increase his Semglee dose at night while decreasing the dose in the morning by the same amount - I advised him to:  Patient Instructions  Please change: - Semglee 12 units in am and 30 units at night  Continue: - FiAsp: try to inject earlier 10-14 units before meals + sliding scale Target 150 ISF 30 This means: - 150-180: + 1 units  - 181-210: + 2 units  - >210: + 3 units   Please come back for a follow-up appointment in 3-4 months.  - we checked his HbA1c: 7.0% (higher) - advised to check sugars at different times of the day - 4x a day, rotating check times - advised for yearly eye exams >> he is UTD - return to clinic in 3-4 months   2.  Dyslipidemia -Reviewed latest lipid panel from 06/2021: Fractions at goal: Lab Results  Component Value Date   CHOL 139 06/18/2021   HDL 43.70 06/18/2021   LDLCALC 69 06/18/2021   TRIG 131.0 06/18/2021   CHOLHDL 3 06/18/2021  -He is not on a statin  Philemon Kingdom, MD PhD [facility] Endocrinology

## 2022-05-04 NOTE — Patient Instructions (Addendum)
Please change: - Semglee 12 units in am and 30 units at night  Continue: - FiAsp: try to inject earlier 10-14 units before meals + sliding scale Target 150 ISF 30 This means: - 150-180: + 1 units  - 181-210: + 2 units  - >210: + 3 units   Please come back for a follow-up appointment in 3-4 months.

## 2022-05-31 ENCOUNTER — Other Ambulatory Visit: Payer: Self-pay | Admitting: Internal Medicine

## 2022-05-31 DIAGNOSIS — E103299 Type 1 diabetes mellitus with mild nonproliferative diabetic retinopathy without macular edema, unspecified eye: Secondary | ICD-10-CM

## 2022-06-30 ENCOUNTER — Other Ambulatory Visit: Payer: Self-pay | Admitting: Internal Medicine

## 2022-06-30 DIAGNOSIS — E103299 Type 1 diabetes mellitus with mild nonproliferative diabetic retinopathy without macular edema, unspecified eye: Secondary | ICD-10-CM

## 2022-07-26 ENCOUNTER — Other Ambulatory Visit: Payer: Self-pay | Admitting: Internal Medicine

## 2022-07-26 DIAGNOSIS — E103299 Type 1 diabetes mellitus with mild nonproliferative diabetic retinopathy without macular edema, unspecified eye: Secondary | ICD-10-CM

## 2022-07-29 ENCOUNTER — Telehealth: Payer: Self-pay | Admitting: Pharmacy Technician

## 2022-07-29 ENCOUNTER — Other Ambulatory Visit (HOSPITAL_COMMUNITY): Payer: Self-pay

## 2022-07-29 NOTE — Telephone Encounter (Signed)
Pharmacy Patient Advocate Encounter  Received notification from Rx Request msgs/CMA that prior authorization for [facility] 3 is required/requested.  Per Test Claim: Dexcom  preferred by the ins. Dexcom G7 sensors $92.16 for 1 month (3 sensors)  If suggested medication is appropriate please send in new script.

## 2022-07-30 ENCOUNTER — Other Ambulatory Visit: Payer: Self-pay | Admitting: Internal Medicine

## 2022-07-30 DIAGNOSIS — E103299 Type 1 diabetes mellitus with mild nonproliferative diabetic retinopathy without macular edema, unspecified eye: Secondary | ICD-10-CM

## 2022-07-30 MED ORDER — FREESTYLE LIBRE 3 SENSOR MISC: 1.0000 | 3 refills | Status: DC

## 2022-08-18 DIAGNOSIS — E103293 Type 1 diabetes mellitus with mild nonproliferative diabetic retinopathy without macular edema, bilateral: Secondary | ICD-10-CM | POA: Diagnosis not present

## 2022-08-18 DIAGNOSIS — H5213 Myopia, bilateral: Secondary | ICD-10-CM | POA: Diagnosis not present

## 2022-08-20 ENCOUNTER — Encounter: Payer: Self-pay | Admitting: Internal Medicine

## 2022-08-20 ENCOUNTER — Ambulatory Visit: Payer: BC Managed Care – PPO | Admitting: Internal Medicine

## 2022-08-20 VITALS — BP 128/82 | HR 90 | Ht 65.0 in | Wt 168.8 lb

## 2022-08-20 DIAGNOSIS — E103299 Type 1 diabetes mellitus with mild nonproliferative diabetic retinopathy without macular edema, unspecified eye: Secondary | ICD-10-CM | POA: Diagnosis not present

## 2022-08-20 DIAGNOSIS — E785 Hyperlipidemia, unspecified: Secondary | ICD-10-CM | POA: Diagnosis not present

## 2022-08-20 LAB — POCT GLYCOSYLATED HEMOGLOBIN (HGB A1C): Hemoglobin A1C: 6.7 % — AB (ref 4.0–5.6)

## 2022-08-20 MED ORDER — DEXCOM G6 TRANSMITTER MISC: 1.0000 | 3 refills | Status: DC

## 2022-08-20 MED ORDER — DEXCOM G6 SENSOR MISC: 1.0000 | 3 refills | Status: AC

## 2022-08-20 MED ORDER — OMNIPOD 5 DEXG7G6 INTRO GEN 5 KIT: 1.0000 | PACK | 0 refills | Status: AC | PRN

## 2022-08-20 MED ORDER — OMNIPOD 5 DEXG7G6 PODS GEN 5 MISC: 1.0000 | 3 refills | Status: AC

## 2022-08-20 MED ORDER — FIASP 100 UNIT/ML IJ SOLN: INTRAMUSCULAR | 11 refills | Status: DC

## 2022-08-20 NOTE — Progress Notes (Unsigned)
Patient ID: Brian NEDEAU, male   DOB: 1985-09-07, 37 y.o.   MRN: [id number]   HPI: [name] is a 37 y.o.-year-old male, returning for f/u for DM1, dx 1996, uncontrolled, with long-term complications (DR). Last visit 3.5 months ago.   Interim hx: He denies increased urination, blurry vision, nausea, chest pain. 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  However, they are less busy during the winter.  During the summer, he is also mowing and sugars are usually better.  Reviewed HbA1c levels: Lab Results  Component Value Date   HGBA1C 7.0 (A) 05/04/2022   HGBA1C 6.6 (A) 12/29/2021   HGBA1C 6.7 (A) 08/18/2021   HGBA1C 7.7 (A) 06/18/2021   HGBA1C 7.7 (A) 02/12/2021   HGBA1C 7.0 (A) 10/09/2020   HGBA1C 8.5 (A) 06/05/2020   HGBA1C 7.6 (A) 01/29/2020   HGBA1C 7.7 (A) 09/27/2019   HGBA1C 7.6 (A) 12/11/2018   HGBA1C 7.8 (A) 07/13/2018   HGBA1C 7.5 (A) 03/13/2018   HGBA1C 7.6 (A) 11/10/2017   HGBA1C 7.1 07/14/2017   HGBA1C 7.4 03/18/2017   HGBA1C 6.8 11/15/2016   HGBA1C 8.7 07/28/2016   HGBA1C 7.2 12/29/2015   HGBA1C 6.4 09/23/2015   HGBA1C 7.0 06/23/2015   01/2019: work: HbA1c 7.0% 01/2018: HbA1c (work): 6.8% (Novolog) 03/25/2016: HbA1c 6.2% 10/11/2013: 7.6% 06/13/2013: 7.3%  He was previously on the Medtronic MiniMed insulin pump up to 2011, but he developed DKA episodes and afterwards, he was not interested in restarting the pump.    He is on: - Lantus 40-42 >> ... >> Semglee 16 >> 12 units in am and 26 >> 30 units at night - Humalog >> FiAsp:  10-14 units before meals + sliding scale Target 150 ISF 30 This means: - 150-180: + 1 units  - 181-210: + 2 units  - >210: + 3 units   Glucometer: Accu-Chek expert meter >> One Touch Verio He continues on Humalog, which is not as efficient for him as NovoLog, but he had a very high co-pay for NovoLog ($600) while for Humalog he only has to pay $25.  Pt checks his  sugars >4 times a day with his CGM:  Previously:  Previously:   Lowest sugar was 55 (took too much insulin  - smaller meal) >> 40s;  he has hypoglycemia awareness in the 60s. He has an unexpired glucagon kit at home. Highest sugar was 471 >> 300 x1 >> 250 >> 300s He had one DKA admission in 2011.  Pt's meals are: - Breakfast: oatmeal or bacon and eggs + cheese biscuit - Lunch: sandwich or hamburger - Dinner: grilled chicken and rice; spaghetti; sloppy joes - Snacks:PB crackers at 10 am; cheeze it's, drinks Gatorade  No history of CKD, last BUN/creatinine: Lab Results  Component Value Date   BUN 14 11/01/2021   CREATININE 1.20 11/01/2021   Lab Results  Component Value Date   GFR 68.68 06/18/2021   GFR 59.65 (L) 09/27/2019   GFR 54.44 (L) 03/25/2015   GFR 78.07 04/09/2014   No MAU: Lab Results  Component Value Date   MICRALBCREAT 0.8 10/09/2020   MICRALBCREAT 1.0 09/27/2019   MICRALBCREAT 1.2 03/13/2018   MICRALBCREAT 1.3 03/18/2017   MICRALBCREAT 2.1 03/29/2016   He has a history of dyslipidemia but latest lipid panel was at goal: Lab Results  Component Value Date   CHOL 139 06/18/2021   HDL 43.70 06/18/2021   LDLCALC 69 06/18/2021   TRIG  131.0 06/18/2021   CHOLHDL 3 06/18/2021   Latest TSH was normal: Lab Results  Component Value Date   TSH 1.24 06/18/2021   - last eye exam was in 2024: + DR, stable, reportedly.   - no numbness and tingling in his feet.  Foot exam: 05/04/2022.  ROS: + see HPI  I reviewed pt's medications, allergies, PMH, social hx, family hx, and changes were documented in the history of present illness. Otherwise, unchanged from my initial visit note.  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 6 each 3   Glucagon 3 MG/DOSE POWD Place 3 mg into the nose once as needed for up to 1 dose. 1 each 11   insulin aspart (FIASP FLEXTOUCH) 100 UNIT/ML FlexTouch Pen INJECT UNDER SKIN 8-14 UNITS BEFORE MEALS 45 mL 1   insulin glargine-yfgn (SEMGLEE, YFGN,) 100 UNIT/ML Pen INJECT up to 35 UNITS INTO THE SKIN AS ADVISED 45 mL 3   Insulin Pen Needle (CLICKFINE PEN NEEDLES) 32G X 4 MM MISC Use 4x a day 400 each 3   ONETOUCH VERIO test strip USE TO CHECK BLOOD SUGAR 4 TIMES A DAY 400 strip 12   tamsulosin (FLOMAX) 0.4 MG CAPS capsule Take 1 capsule (0.4 mg total) by mouth daily. 30 capsule 0   No current facility-administered medications for this visit.   Allergies  Allergen Reactions   Basaglar Kwikpen [Insulin Glargine] Other (See Comments)    Ineffective   Humalog [Insulin Lispro] Other (See Comments)    Ineffective   Family history: None  PE: BP 128/82 (BP Location: Left Arm, Patient Position: Sitting, Cuff Size: Normal)   Pulse 90   Ht 5\' 5"  (1.651 m)   Wt 168 lb 12.8 oz (76.6 kg)   SpO2 97%   BMI 28.09 kg/m   Wt Readings from Last 3 Encounters:  08/20/22 168 lb 12.8 oz (76.6 kg)  05/04/22 168 lb 3.2 oz (76.3 kg)  12/29/21 174 lb 12.8 oz (79.3 kg)   Constitutional: overweight, in NAD Eyes: no exophthalmos ENT: no  masses palpated in neck, no cervical lymphadenopathy Cardiovascular: RRR, No MRG Respiratory: CTA B Musculoskeletal: no deformities Skin:  no rashes Neurological: no tremor with outstretched hands  ASSESSMENT: 1. DM1, uncontrolled, with complications (background DR)  -He keeps his blood sugars higher during the summer on purpose, as he is more active, mowing -Per  records from Dr.  Solum, patient's diabetes was never very uncontrolled, his range is 7.2-7.6%. I suspect that he still has some insulin production from his pancreas.  -He is not interested in an insulin pump.  2. Dyslipidemia  PLAN:  1. Patient with longstanding, uncontrolled, type 1 diabetes, with increased variability of his blood sugars, on a basal bolus insulin regimen.  HbA1c was 7.0% at last visit, higher.  At that time, sugars were increasing overnight and then again after every meal, despite bolusing Fiasp consistently 15 minutes before the meal.  He tried injecting before the meal but blood sugars were even higher).  He just started to use longer pen needles before last visit and injecting in the size of his abdomen, towards the back, and he felt that this was working better for him.  However, sugars were still high after every meal and we discussed about possibly moving Fiasp earlier but we also increased his Semglee dose at night and decrease the dose in the morning by the same amount. -At last visit, we again discussed about going back on an insulin pump.  He was on the Medtronic pump before and he did not want to return to this.  I suggested the t:slim or the OmniPod pump.  I gave him brochures for these.  He was not sure whether this would be covered by his insurance. CGM interpretation: -At today's visit, we reviewed his CGM downloads: It appears that 50% of values are in target range (goal >70%), while 46% are higher than 180 (goal <25%), and 4% are lower than 70 (goal <4%).  The calculated average blood sugar is 178.   The projected HbA1c for the next 3 months ([facility]MI) is 7.6%. -Reviewing the CGM trends, sugars are still fluctuating, mostly around the upper limit of the target range, with better blood sugars in the afternoon and increase sugars after dinner.  He is still injecting in the lateral sides of the abdomen. -Upon questioning, he is having Gatorade that he is not adequately covering with insulin and also crackers (cheese its) for which she does not take insulin.  We discussed about ideally not eating between meals or if he does, to cover this with insulin.  This would be much easier to be done on an insulin pump.  At today's visit, I printed prescriptions for the OmniPod 5 and Dexcom G6 CGM for him.  He is not sure whether he can afford them.  We got a message from his insurance that the libre 3 CGM is not covered but Dexcom is so I am really hoping that we will be able to start the system for him.  For now, since he is running out of sensors, we gave him 2 samples of the libre 3 CGM. - I advised him to:  Patient Instructions  Please continue: - Semglee 12 units in am and 30 units at night - FiAsp: 10-14 units before meals + sliding scale Target 150 ISF 30 This means: - 150-180: + 1 units  - 181-210: + 2 units  - >210: + 3 units   DO NOT SNACK BETWEEN MEALS.  Please come back for a follow-up appointment in 3-4 months.  - we checked his HbA1c: 6.7% (lower) - advised to check sugars at different times of the day - 4x a day, rotating check times - advised for yearly eye exams >> he is UTD - he is due for annual labs -will check these today - return to clinic in 3-4 months   2.  Dyslipidemia -Reviewed latest lipid panel from 06/2021: Fractions at goal:  Lab Results  Component Value Date   CHOL 139 06/18/2021   HDL 43.70 06/18/2021   LDLCALC 69 06/18/2021   TRIG 131.0 06/18/2021   CHOLHDL 3 06/18/2021  -He is not on a statin -Will check another lipid panel today ([facility])  Carlus Pavlov, MD  PhD [facility] Endocrinology

## 2022-08-20 NOTE — Patient Instructions (Signed)
Please continue: - Semglee 12 units in am and 30 units at night - FiAsp: 10-14 units before meals + sliding scale Target 150 ISF 30 This means: - 150-180: + 1 units  - 181-210: + 2 units  - >210: + 3 units   DO NOT SNACK BETWEEN MEALS.  Please come back for a follow-up appointment in 3-4 months.

## 2022-08-21 LAB — COMPREHENSIVE METABOLIC PANEL
AG Ratio: 1.6 (calc) (ref 1.0–2.5)
ALT: 15 U/L (ref 9–46)
AST: 17 U/L (ref 10–40)
Albumin: 4.9 g/dL (ref 3.6–5.1)
Alkaline phosphatase (APISO): 84 U/L (ref 36–130)
BUN/Creatinine Ratio: 6 (calc) (ref 6–22)
BUN: 8 mg/dL (ref 7–25)
CO2: 28 mmol/L (ref 20–32)
Calcium: 10.4 mg/dL — ABNORMAL HIGH (ref 8.6–10.3)
Chloride: 106 mmol/L (ref 98–110)
Creat: 1.28 mg/dL — ABNORMAL HIGH (ref 0.60–1.26)
Globulin: 3 g/dL (calc) (ref 1.9–3.7)
Glucose, Bld: 72 mg/dL (ref 65–99)
Potassium: 4.4 mmol/L (ref 3.5–5.3)
Sodium: 141 mmol/L (ref 135–146)
Total Bilirubin: 0.7 mg/dL (ref 0.2–1.2)
Total Protein: 7.9 g/dL (ref 6.1–8.1)

## 2022-08-21 LAB — MICROALBUMIN / CREATININE URINE RATIO
Creatinine, Urine: 258 mg/dL (ref 20–320)
Microalb Creat Ratio: 16 mg/g creat (ref <30)
Microalb, Ur: 4 mg/dL

## 2022-08-21 LAB — LIPID PANEL
Cholesterol: 134 mg/dL (ref <200)
HDL: 49 mg/dL (ref >=40)
LDL Cholesterol (Calc): 69 mg/dL (calc)
Non-HDL Cholesterol (Calc): 85 mg/dL (calc) (ref <130)
Total CHOL/HDL Ratio: 2.7 (calc) (ref <5.0)
Triglycerides: 76 mg/dL (ref <150)

## 2022-08-21 LAB — TSH: TSH: 1.23 mIU/L (ref 0.40–4.50)

## 2022-08-26 ENCOUNTER — Encounter: Payer: Self-pay | Admitting: Internal Medicine

## 2022-09-03 ENCOUNTER — Encounter: Payer: Self-pay | Admitting: Internal Medicine

## 2022-09-03 LAB — HM DIABETES EYE EXAM

## 2022-10-02 ENCOUNTER — Other Ambulatory Visit: Payer: Self-pay | Admitting: Internal Medicine

## 2022-10-28 ENCOUNTER — Other Ambulatory Visit: Payer: Self-pay | Admitting: Internal Medicine

## 2022-12-27 ENCOUNTER — Encounter: Payer: Self-pay | Admitting: Internal Medicine

## 2022-12-27 ENCOUNTER — Ambulatory Visit: Payer: BLUE CROSS/BLUE SHIELD | Admitting: Internal Medicine

## 2022-12-27 VITALS — BP 120/70 | HR 87 | Ht 65.0 in | Wt 172.8 lb

## 2022-12-27 DIAGNOSIS — E785 Hyperlipidemia, unspecified: Secondary | ICD-10-CM | POA: Diagnosis not present

## 2022-12-27 DIAGNOSIS — E103299 Type 1 diabetes mellitus with mild nonproliferative diabetic retinopathy without macular edema, unspecified eye: Secondary | ICD-10-CM | POA: Diagnosis not present

## 2022-12-27 DIAGNOSIS — Z794 Long term (current) use of insulin: Secondary | ICD-10-CM

## 2022-12-27 LAB — POCT GLYCOSYLATED HEMOGLOBIN (HGB A1C): Hemoglobin A1C: 6.9 % — AB (ref 4.0–5.6)

## 2022-12-27 NOTE — Progress Notes (Signed)
Patient ID: Brian Braun, male   DOB: 1985/11/28, 37 y.o.   MRN: [id number]   HPI: Brian Braun is a 37 y.o.-year-old male, returning for f/u for DM1, dx 1996, uncontrolled, with long-term complications (DR). Last visit 4 months ago.  He now has a new insurance: [employer].  Interim hx: He denies increased urination, blurry vision, nausea, chest pain. 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  However, they are less busy during the winter.  During the summer, he is also mowing and sugars are usually better.  Reviewed HbA1c levels: Lab Results  Component Value Date   HGBA1C 6.7 (A) 08/20/2022   HGBA1C 7.0 (A) 05/04/2022   HGBA1C 6.6 (A) 12/29/2021   HGBA1C 6.7 (A) 08/18/2021   HGBA1C 7.7 (A) 06/18/2021   HGBA1C 7.7 (A) 02/12/2021   HGBA1C 7.0 (A) 10/09/2020   HGBA1C 8.5 (A) 06/05/2020   HGBA1C 7.6 (A) 01/29/2020   HGBA1C 7.7 (A) 09/27/2019   HGBA1C 7.6 (A) 12/11/2018   HGBA1C 7.8 (A) 07/13/2018   HGBA1C 7.5 (A) 03/13/2018   HGBA1C 7.6 (A) 11/10/2017   HGBA1C 7.1 07/14/2017   HGBA1C 7.4 03/18/2017   HGBA1C 6.8 11/15/2016   HGBA1C 8.7 07/28/2016   HGBA1C 7.2 12/29/2015   HGBA1C 6.4 09/23/2015  01/2019: work: HbA1c 7.0% 01/2018: HbA1c (work): 6.8% (Novolog) 03/25/2016: HbA1c 6.2% 10/11/2013: 7.6% 06/13/2013: 7.3%  He was previously on the Medtronic MiniMed insulin pump up to 2011, but he developed DKA episodes and afterwards, he was not interested in restarting the pump.    He is on: - Lantus 40-42 >> ... >> Semglee 16 >> 12 units in am and 26 >> 30 units at night - Humalog >> FiAsp:  10-14 units before meals + sliding scale Target 150 ISF 30 This means: - 150-180: + 1 units  - 181-210: + 2 units  - >210: + 3 units   Glucometer: Accu-Chek expert meter >> One Touch Verio He continues on Humalog, which is not as efficient for him as NovoLog, but he had a very high co-pay for NovoLog ($600) while for  Humalog he only has to pay $25.  Pt checks his sugars >4 times a day with his CGM:  Previously:  Previously:   Lowest sugar was 55 (took too much insulin  - smaller meal) >> 40s >> 40s;  he has hypoglycemia awareness in the 60s. He has an unexpired glucagon kit at home. Highest sugar was 471 >> 300 x1 >> 250 >> 300s >> 400s. He had one DKA admission in 2011.  Pt's meals are: - Breakfast: oatmeal or bacon and eggs + cheese biscuit - Lunch: sandwich or hamburger - Dinner: grilled chicken and rice; spaghetti; sloppy joes - Snacks:PB crackers at 10 am; cheeze it's, drinks Gatorade  No history of CKD, last BUN/creatinine: Lab Results  Component Value Date   BUN 8 08/20/2022   CREATININE 1.28 (H) 08/20/2022   Lab Results  Component Value Date   GFR 68.68 06/18/2021   GFR 59.65 (L) 09/27/2019   GFR 54.44 (L) 03/25/2015   GFR 78.07 04/09/2014   No MAU: Lab Results  Component Value Date   MICRALBCREAT 16 08/20/2022   MICRALBCREAT 0.8 10/09/2020   MICRALBCREAT 1.0 09/27/2019   MICRALBCREAT 1.2 03/13/2018   MICRALBCREAT 1.3 03/18/2017   He has a history of dyslipidemia but latest lipid panel was at goal: Lab Results  Component Value Date   CHOL 134  08/20/2022   HDL 49 08/20/2022   LDLCALC 69 08/20/2022   TRIG 76 08/20/2022   CHOLHDL 2.7 08/20/2022   Latest TSH was normal: Lab Results  Component Value Date   TSH 1.23 08/20/2022   - last eye exam was in 2024: + DR, stable, reportedly.   - no numbness and tingling in his feet.  Foot exam: 05/04/2022.  ROS: + see HPI  I reviewed pt's medications, allergies, PMH, social hx, family hx, and changes were documented in the history of present illness. Otherwise, unchanged from my initial visit note.  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 1 each 3   Glucagon 3 MG/DOSE POWD Place 3 mg into the nose once as needed for up to 1 dose. 1 each 11   insulin aspart (FIASP FLEXTOUCH) 100 UNIT/ML FlexTouch Pen INJECT UNDER SKIN 8-14 UNITS BEFORE MEALS 45 mL 1   Insulin Aspart, w/Niacinamide, (FIASP) 100 UNIT/ML SOLN Use up to 80 units daily in the insulin pump 30 mL 11   Insulin Disposable Pump (OMNIPOD 5 G6 INTRO, GEN 5,) KIT 1 each by Does not apply route as needed. 1 kit 0   Insulin Disposable Pump (OMNIPOD 5 G6 PODS, GEN 5,) MISC 1 each by Does not apply route every 3 (three) days. 30 each 3   insulin glargine-yfgn (SEMGLEE, YFGN,) 100 UNIT/ML Pen INJECT 12 UNITS IN THE MORNING AND 30 UNITS AT NIGHT 45 mL 3   Insulin Pen Needle (CLICKFINE PEN NEEDLES) 32G X 4 MM MISC Use 4x a day 400 each 3   ONETOUCH VERIO test strip USE TO CHECK BLOOD SUGAR 4 TIMES A DAY 400 strip 12   tamsulosin (FLOMAX) 0.4 MG CAPS capsule Take 1 capsule (0.4 mg total) by mouth daily. 30 capsule 0   No current facility-administered medications for this visit.   Allergies  Allergen Reactions   Basaglar Kwikpen [Insulin Glargine] Other (See Comments)    Ineffective   Humalog [Insulin Lispro] Other (See Comments)    Ineffective   Family  history: None  PE: BP 120/70   Pulse 87   Ht 5\' 5"  (1.651 m)   Wt 172 lb 12.8 oz (78.4 kg)   SpO2 98%   BMI 28.76 kg/m   Wt Readings from Last 3 Encounters:  12/27/22 172 lb 12.8 oz (78.4 kg)  08/20/22 168 lb 12.8 oz (76.6 kg)  05/04/22 168  lb 3.2 oz (76.3 kg)   Constitutional: overweight, in NAD Eyes: no exophthalmos ENT: no masses palpated in neck, no cervical lymphadenopathy Cardiovascular: RRR, No MRG Respiratory: CTA B Musculoskeletal: no deformities Skin:  no rashes Neurological: no tremor with outstretched hands  ASSESSMENT: 1. DM1, uncontrolled, with complications (background DR)  -He keeps his blood sugars higher during the summer on purpose, as he is more active, mowing -Per  records from Dr. Tedd Sias, patient's diabetes was never very uncontrolled, his range is 7.2-7.6%. I suspect that he still has some insulin production from his pancreas.  -He is not interested in an insulin pump.  2. Dyslipidemia  PLAN:  1. Patient with longstanding, uncontrolled, type 1 diabetes, with increased variability of blood sugars, on a basal/bolus insulin regimen.  HbA1c decreased at last visit to 6.7%, however, reviewing the CGM trends, they were mostly around the upper limit of the target range with better blood sugar control in the afternoon and increased blood sugars after dinner.  He was still injecting in the lateral sides of the abdomen.  Upon questioning, he was drinking Gatorade that he was not adequately covering with insulin and also eating crackers for which he was not taking insulin.  We discussed about ideally not eating between meals or, if he did, to cover these with insulin.  We discussed that this would be done much easier on an insulin pump.  At last visit, I printed prescriptions for the OmniPod 5 and Dexcom G6 CGM for him.  He was not sure whether he could afford them.  We got a message from his insurance that the libre 3 CGM was not covered but Dexcom was. CGM  interpretation: -At today's visit, we reviewed his CGM downloads: It appears that 46% of values are in target range (goal >70%), while 52% are higher than 180 (goal <25%), and 3% are lower than 70 (goal <4%).  The calculated average blood sugar is 185.  The projected HbA1c for the next 3 months (GMI) is 7.7%. -Reviewing the CGM trends, sugars appear to continue to fluctuate quite significantly, in a sawtooth pattern.  Upon questioning, patient is having multiple snacks throughout the day (e.g. Cheeze its) and he boluses few units for them, but likely not enough.  The sugars increase afterwards and then he corrects with 3 to 5 units of insulin, with subsequent dramatic drops in blood sugars.  We discussed about reducing the snacks and maybe having just 1 snack between 2 main meals, for which he may need to bolus more insulin.  Afterwards, we need to relax his correction factor and I advised him not to bolus more than 3 units for correction.  I gave him a new sensitivity factor.  Will otherwise continue the same dose of Semglee. -He just changed his insurance and I advised him to see if they cover OmniPod 5 (per his preference, as he would prefer a tubeless pump) degraded with the Dexcom CGM.  I advised him to let me know after he finds out. - I advised him to:  Patient Instructions  Please use. - Semglee 12 units in am and 30 units at night - FiAsp: 10-14 units before meals + sliding scale Target 150 ISF 50 This means: - 150-200: + 1 units  - 201-250: + 2 units  - >250: + 3 units   Reduce SNACKs BETWEEN MEALS.  When you correct high blood sugars, try to use less than 3 units of insulin.  Check with your insurance if we  can use Omnipod 5 + Dexcom CGM.  Please come back for a follow-up appointment in 3-4 months.  - we checked his HbA1c: 6.9% (higher) - advised to check sugars at different times of the day - 4x a day, rotating check times - advised for yearly eye exams >> he is UTD - return  to clinic in 3-4 months   2.  Dyslipidemia -Reviewed the latest lipid panel from 08/2022: Fractions at goal: Lab Results  Component Value Date   CHOL 134 08/20/2022   HDL 49 08/20/2022   LDLCALC 69 08/20/2022   TRIG 76 08/20/2022   CHOLHDL 2.7 08/20/2022  -He is not on a statin  Carlus Pavlov, MD PhD [facility] Endocrinology

## 2022-12-27 NOTE — Patient Instructions (Addendum)
Please use. - Semglee 12 units in am and 30 units at night - FiAsp: 10-14 units before meals + sliding scale Target 150 ISF 50 This means: - 150-200: + 1 units  - 201-250: + 2 units  - >250: + 3 units   Reduce SNACKs BETWEEN MEALS.  When you correct high blood sugars, try to use less than 3 units of insulin.  Check with your insurance if we can use Omnipod 5 + Dexcom CGM.  Please come back for a follow-up appointment in 3-4 months.

## 2023-01-05 ENCOUNTER — Other Ambulatory Visit: Payer: Self-pay | Admitting: Internal Medicine

## 2023-01-11 ENCOUNTER — Other Ambulatory Visit: Payer: Self-pay | Admitting: Internal Medicine

## 2023-01-12 ENCOUNTER — Other Ambulatory Visit: Payer: Self-pay | Admitting: Internal Medicine

## 2023-01-12 ENCOUNTER — Telehealth: Payer: Self-pay

## 2023-01-12 DIAGNOSIS — E103299 Type 1 diabetes mellitus with mild nonproliferative diabetic retinopathy without macular edema, unspecified eye: Secondary | ICD-10-CM

## 2023-01-12 MED ORDER — TRESIBA FLEXTOUCH 200 UNIT/ML ~~LOC~~ SOPN: PEN_INJECTOR | SUBCUTANEOUS | 3 refills | Status: DC

## 2023-01-12 NOTE — Telephone Encounter (Signed)
Copied and paste from Refill request. Semglee not covered   OK to change to [place] U200 - same dose  Ty!  C   Requested Prescriptions   Signed Prescriptions Disp Refills   insulin degludec (TRESIBA FLEXTOUCH) 200 UNIT/ML FlexTouch Pen 27 mL 3    Sig: INJECT 12 UNITS IN THE MORNING AND 30 UNITS AT NIGHT    Authorizing Provider: Carlus Pavlov    Ordering User: Pollie Meyer

## 2023-03-10 ENCOUNTER — Other Ambulatory Visit: Payer: Self-pay | Admitting: Internal Medicine

## 2023-03-10 DIAGNOSIS — E103299 Type 1 diabetes mellitus with mild nonproliferative diabetic retinopathy without macular edema, unspecified eye: Secondary | ICD-10-CM

## 2023-04-10 ENCOUNTER — Other Ambulatory Visit: Payer: Self-pay | Admitting: Internal Medicine

## 2023-04-10 DIAGNOSIS — E103299 Type 1 diabetes mellitus with mild nonproliferative diabetic retinopathy without macular edema, unspecified eye: Secondary | ICD-10-CM

## 2023-05-02 ENCOUNTER — Ambulatory Visit: Payer: BLUE CROSS/BLUE SHIELD | Admitting: Internal Medicine

## 2023-05-02 ENCOUNTER — Encounter: Payer: Self-pay | Admitting: Internal Medicine

## 2023-05-02 VITALS — BP 124/70 | HR 92 | Ht 65.0 in | Wt 175.2 lb

## 2023-05-02 DIAGNOSIS — E785 Hyperlipidemia, unspecified: Secondary | ICD-10-CM

## 2023-05-02 DIAGNOSIS — E103299 Type 1 diabetes mellitus with mild nonproliferative diabetic retinopathy without macular edema, unspecified eye: Secondary | ICD-10-CM

## 2023-05-02 LAB — POCT GLYCOSYLATED HEMOGLOBIN (HGB A1C): Hemoglobin A1C: 6.8 % — AB (ref 4.0–5.6)

## 2023-05-02 NOTE — Progress Notes (Signed)
 Patient ID: Brian Braun, male   DOB: 16-Sep-1985, 38 y.o.   MRN: [id number]   HPI: Brian Braun is a 38 y.o.-year-old male, returning for f/u for DM1, dx 1996, uncontrolled, with long-term complications (DR). Last visit 4 months ago.  He now has a new insurance: [employer].  Interim hx: He denies increased urination, blurry vision, nausea, chest pain. 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  However, they are less busy during the winter.  During the summer, he is also mowing and sugars are usually better.  Reviewed HbA1c levels: Lab Results  Component Value Date   HGBA1C 6.9 (A) 12/27/2022   HGBA1C 6.7 (A) 08/20/2022   HGBA1C 7.0 (A) 05/04/2022   HGBA1C 6.6 (A) 12/29/2021   HGBA1C 6.7 (A) 08/18/2021   HGBA1C 7.7 (A) 06/18/2021   HGBA1C 7.7 (A) 02/12/2021   HGBA1C 7.0 (A) 10/09/2020   HGBA1C 8.5 (A) 06/05/2020   HGBA1C 7.6 (A) 01/29/2020   HGBA1C 7.7 (A) 09/27/2019   HGBA1C 7.6 (A) 12/11/2018   HGBA1C 7.8 (A) 07/13/2018   HGBA1C 7.5 (A) 03/13/2018   HGBA1C 7.6 (A) 11/10/2017   HGBA1C 7.1 07/14/2017   HGBA1C 7.4 03/18/2017   HGBA1C 6.8 11/15/2016   HGBA1C 8.7 07/28/2016   HGBA1C 7.2 12/29/2015  01/2019: work: HbA1c 7.0% 01/2018: HbA1c (work): 6.8% (Novolog ) 03/25/2016: HbA1c 6.2% 10/11/2013: 7.6% 06/13/2013: 7.3%  He was previously on the Medtronic MiniMed insulin  pump up to 2011, but he developed DKA episodes and afterwards, he was not interested in restarting the pump.    He is on: - Lantus  40-42 >> ... >> Semglee  16 >> 12 units in am and 26 >> 30 units at night >> Tresiba  U200 12 units in a.m. and 30 units at night - Humalog  >> FiAsp :  10-14 units before meals + sliding scale Target 150 ISF 30 >> ISF 50 This means: - 150-200: + 1 units  - 201-250: + 2 units  - >250: + 3 units   Glucometer: Accu-Chek expert meter >> One Touch Verio He continues on Humalog , which is not as efficient for him as  NovoLog , but he had a very high co-pay for NovoLog  ($600) while for Humalog  he only has to pay $25.  Pt checks his sugars >4 times a day with his CGM:  Previously:  Previously:   Lowest sugar was 40s >> 40s >> 50s;  he has hypoglycemia awareness in the 60s. He has an unexpired glucagon  kit at home. Highest sugar was 471 >> .SABRASABRA300s >> 400s >> 400. He had one DKA admission in 2011.  Pt's meals are: - Breakfast: oatmeal or bacon and eggs + cheese biscuit - Lunch: sandwich or hamburger - Dinner: grilled chicken and rice; spaghetti; sloppy joes - Snacks:PB crackers at 10 am; cheeze it's, drinks Gatorade  No history of CKD, last BUN/creatinine: Lab Results  Component Value Date   BUN 8 08/20/2022   CREATININE 1.28 (H) 08/20/2022   Lab Results  Component Value Date   GFR 68.68 06/18/2021   GFR 59.65 (L) 09/27/2019   GFR 54.44 (L) 03/25/2015   GFR 78.07 04/09/2014   No MAU: Lab Results  Component Value Date   MICRALBCREAT 16 08/20/2022   MICRALBCREAT 0.8 10/09/2020   MICRALBCREAT 1.0 09/27/2019   MICRALBCREAT 1.2 03/13/2018   MICRALBCREAT 1.3 03/18/2017   He has a history of dyslipidemia but latest lipid panel was at goal: Lab Results  Component Value  Date   CHOL 134 08/20/2022   HDL 49 08/20/2022   LDLCALC 69 08/20/2022   TRIG 76 08/20/2022   CHOLHDL 2.7 08/20/2022   Latest TSH was normal: Lab Results  Component Value Date   TSH 1.23 08/20/2022   - last eye exam was 09/03/2022: + DR.   - no numbness and tingling in his feet.  Foot exam: 05/04/2022.  ROS: + see HPI  I reviewed pt's medications, allergies, PMH, social hx, family hx, and changes were documented in the history of present illness. Otherwise, unchanged from my initial visit note.  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 2 each 26   Continuous Glucose Transmitter (DEXCOM G6 TRANSMITTER) MISC 1 Device by Does not apply route every 3 (three) months. 1 each 3   Glucagon  3 MG/DOSE POWD Place 3 mg into the nose once as needed for up to 1 dose. 1 each 11   insulin  aspart (FIASP  FLEXTOUCH) 100 UNIT/ML FlexTouch Pen INJECT UNDER SKIN 8-14 UNITS BEFORE MEALS 15 mL 1   Insulin  Aspart, w/Niacinamide, (FIASP ) 100 UNIT/ML SOLN Use up to 80 units daily in the insulin  pump 30 mL 11   insulin  degludec (TRESIBA  FLEXTOUCH) 200 UNIT/ML FlexTouch Pen INJECT 12 UNITS IN THE MORNING AND 30 UNITS AT NIGHT 27 mL 3   Insulin  Disposable Pump (OMNIPOD 5 G6 INTRO, GEN 5,) KIT 1 each by Does not apply route as needed. 1 kit 0   Insulin  Disposable Pump (OMNIPOD 5 G6 PODS, GEN 5,) MISC 1 each by Does not apply route every 3 (three) days. 30 each 3   insulin  glargine-yfgn (SEMGLEE , YFGN,) 100 UNIT/ML Pen INJECT 12 UNITS IN THE MORNING AND 30 UNITS AT NIGHT 45 mL 3   ONETOUCH VERIO test strip USE TO CHECK BLOOD SUGAR 4 TIMES A DAY 400 strip 12   tamsulosin  (FLOMAX ) 0.4 MG CAPS capsule Take 1 capsule (0.4 mg total) by mouth daily. 30 capsule 0   No  current facility-administered medications for this visit.   Allergies  Allergen Reactions   Basaglar  Kwikpen [Insulin  Glargine] Other (See Comments)    Ineffective   Humalog  [Insulin  Lispro] Other (See Comments)    Ineffective   Family history: None  PE: BP 124/70   Pulse 92  Ht 5' 5 (1.651 m)   Wt 175 lb 3.2 oz (79.5 kg)   SpO2 97%   BMI 29.15 kg/m   Wt Readings from Last 3 Encounters:  05/02/23 175 lb 3.2 oz (79.5 kg)  12/27/22 172 lb 12.8 oz (78.4 kg)  08/20/22 168 lb 12.8 oz (76.6 kg)   Constitutional: overweight, in NAD Eyes: no exophthalmos ENT: no masses palpated in neck, no cervical lymphadenopathy Cardiovascular: tachycardia, RR, No MRG Respiratory: CTA B Musculoskeletal: no deformities Skin:  no rashes Neurological: no tremor with outstretched hands Diabetic Foot Exam - Simple   Simple Foot Form Diabetic Foot exam was performed with the following findings: Yes 05/02/2023  4:23 PM  Visual Inspection No deformities, no ulcerations, no other skin breakdown bilaterally: Yes Sensation Testing Intact to touch and monofilament testing bilaterally: Yes Pulse Check Posterior Tibialis and Dorsalis pulse intact bilaterally: Yes Comments Dry skin Medial calluses L foot    ASSESSMENT: 1. DM1, uncontrolled, with complications (background DR)  -He keeps his blood sugars higher during the summer on purpose, as he is more active, mowing -Per  records from Dr. Damian, patient's diabetes was never very uncontrolled, his range is 7.2-7.6%. I suspect that he still has some insulin  production from his pancreas.  -He was not interested in an insulin  pump.  2. Dyslipidemia  PLAN:  1. Patient with longstanding, uncontrolled, type 1 diabetes, with increased blood sugar variability, on a basal-bolus insulin  regimen.  At last visit, HbA1c was 6.9%, higher than before.  His blood sugars are usually fluctuating significantly, in a sawtooth pattern [place] but at last visit his  sugars were higher after every meal particularly dinner.  He was having multiple snacks throughout the day (for example Cheeze it's) and I advised him to try to reduce snacking between meals.  He was previously also drinking sugary Gatorade and we discussed about either stopping this or covering it appropriately with insulin .  At last visit we also discussed again about an insulin  pump.  I printed prescriptions for the OmniPod 5 and Dexcom for him to check to see if he could afford them.  He was not against the pump at that time if this was not too expensive.   -Since last visit, we had to switch from Semglee  to Tresiba  per insurance preference.  We are using the U200 concentration. CGM interpretation: -At today's visit, we reviewed his CGM downloads: It appears that 52% of values are in target range (goal >70%), while 46% are higher than 180 (goal <25%), and 2% are lower than 70 (goal <4%).  The calculated average blood sugar is 178 7.6%.  The projected HbA1c for the next 3 months (GMI) is 7.6%. -Reviewing the CGM trends, sugars have improved significantly overnight and now a significant amount of blood sugars are at goal with some increases after breakfast and lunch but with significant increase in blood sugars after dinner.  Overall, apparently still with increased blood sugars after meals and snacks with precipitous drops afterwards.  We discussed at last visit about not correcting postprandial hyperglycemia with more than 3 units of insulin  and he is doing so.  Also, he is trying his best to bolus insulin  before the meal, in fact, despite using Fiasp , he is sometimes injecting 15 to 30 minutes before the meal. -For now, we discussed about the possibility of needing more insulin  for dinner and he will try this.  I also recommended to change to Tresiba  to only 1 injection a day, as I explained  that this insulin  has a long half-life so twice a day injection is not necessary.  Will continue the rest of the  regimen.  I again encouraged him to look into an OmniPod insulin  pump.  He wanted to wait until the first of the year to look into this.  He will do so before next visit. - I advised him to:  Patient Instructions  Please change: - Tresiba  U200 42 units in am  Continue: - FiAsp : 10-14 units before meals + sliding scale Target 150 ISF 50 This means: - 150-200: + 1 units  - 201-250: + 2 units  - >250: + 3 units  When you correct high blood sugars, try to use less than 3 units of insulin .   Check with your insurance if we can use Omnipod 5 + Dexcom CGM.   Please come back for a follow-up appointment in 3-4 months.  - we checked his HbA1c: 6.8% (lower) - advised to check sugars at different times of the day - 4x a day, rotating check times - advised for yearly eye exams >> he is UTD - return to clinic in 3-4 months   2.  Dyslipidemia -Reviewed latest lipid panel from 08/2022: All fractions at goal: Lab Results  Component Value Date   CHOL 134 08/20/2022   HDL 49 08/20/2022   LDLCALC 69 08/20/2022   TRIG 76 08/20/2022   CHOLHDL 2.7 08/20/2022  -He is not on a statin  Lela Fendt, MD PhD [facility] Endocrinology

## 2023-05-02 NOTE — Patient Instructions (Addendum)
 Please change: - Tresiba  U200 42 units in am  Continue: - FiAsp : 10-14 units before meals + sliding scale Target 150 ISF 50 This means: - 150-200: + 1 units  - 201-250: + 2 units  - >250: + 3 units  When you correct high blood sugars, try to use less than 3 units of insulin .   Check with your insurance if we can use Omnipod 5 + Dexcom CGM.   Please come back for a follow-up appointment in 3-4 months.

## 2023-05-28 ENCOUNTER — Ambulatory Visit
Admission: EM | Admit: 2023-05-28 | Discharge: 2023-05-28 | Disposition: A | Discharge status: Home / Self Care | Payer: BLUE CROSS/BLUE SHIELD | Attending: Emergency Medicine | Admitting: Emergency Medicine

## 2023-05-28 DIAGNOSIS — J020 Streptococcal pharyngitis: Secondary | ICD-10-CM | POA: Insufficient documentation

## 2023-05-28 LAB — GROUP A STREP BY PCR: Group A Strep by PCR: DETECTED — AB

## 2023-05-28 MED ORDER — AMOXICILLIN-POT CLAVULANATE 875-125 MG PO TABS: 1.0000 | ORAL_TABLET | Freq: Two times a day (BID) | ORAL | 0 refills | Status: AC

## 2023-05-28 NOTE — Discharge Instructions (Signed)
 Take the Augmentin twice daily for 10 days for treatment of your strep throat.  Gargle with warm salt water 2-3 times a day to soothe your throat, aid in pain relief, and aid in healing.  Take over-the-counter ibuprofen according to the package instructions as needed for pain.  You can also use Chloraseptic or Sucrets lozenges, 1 lozenge every 2 hours as needed for throat pain.  If you develop any new or worsening symptoms return for reevaluation.

## 2023-05-28 NOTE — ED Provider Notes (Signed)
 [facility]    CSN: [id number] Arrival date & time: 05/28/23  1426      History   Chief Complaint Chief Complaint  Patient presents with   Sore Throat    HPI Brian Braun is a 38 y.o. male.   HPI  38 year old male with past medical history significant for type 1 diabetes, diabetic retinopathy, and dyslipidemia presents for evaluation of 3 days worth of sore throat and swollen uvula.  He states that it is painful to swallow.  No associated fever, runny nose, nasal congestion, or cough.  Patient's blood pressure at triage was 147/105.  Patient does not have a documented history of high blood pressure and states that when he goes to his endocrinologist his blood pressure is always normal.  He did take TheraFlu prior to coming to the clinic however.  Past Medical History:  Diagnosis Date   Diabetes mellitus without complication (HCC)    Type I     Patient Active Problem List   Diagnosis Date Noted   Encounter for commercial driving license (CDL) exam 93/76/7978   Dyslipidemia 11/10/2017   Abnormal kidney function 03/18/2017   Type 1 diabetes mellitus with background diabetic retinopathy (HCC) 06/23/2015    Past Surgical History:  Procedure Laterality Date   NECK SURGERY  07/2011       Home Medications    Prior to Admission medications   Medication Sig Start Date End Date Taking? Authorizing Provider  amoxicillin -clavulanate (AUGMENTIN ) 875-125 MG tablet Take 1 tablet by mouth every 12 (twelve) hours for 10 days. 05/28/23 06/07/23 Yes Bernardino Ditch, NP  ACCU-CHEK SOFTCLIX LANCETS lancets USE TO TEST BLOOD SUGAR 4 TIMES A DAY 11/15/16   Trixie File, MD  BD PEN NEEDLE MICRO U/F 32G X 6 MM MISC USE 4 TIMES A DAY 03/11/23   Trixie File, MD  Continuous Glucose Sensor (FREESTYLE LIBRE 3 SENSOR) MISC 1 EACH BY DOES NOT APPLY ROUTE EVERY 14 (FOURTEEN) DAYS. 01/05/23   Trixie File, MD  Continuous Glucose Transmitter (DEXCOM G6 TRANSMITTER) MISC 1  Device by Does not apply route every 3 (three) months. Patient not taking: Reported on 05/02/2023 08/20/22   Trixie File, MD  Glucagon  3 MG/DOSE POWD Place 3 mg into the nose once as needed for up to 1 dose. 02/12/21   Trixie File, MD  insulin  aspart (FIASP  FLEXTOUCH) 100 UNIT/ML FlexTouch Pen INJECT UNDER SKIN 8-14 UNITS BEFORE MEALS 04/10/23  Yes Trixie File, MD  Insulin  Aspart, w/Niacinamide, (FIASP ) 100 UNIT/ML SOLN Use up to 80 units daily in the insulin  pump Patient not taking: Reported on 05/02/2023 08/20/22   Trixie File, MD  insulin  degludec (TRESIBA  FLEXTOUCH) 200 UNIT/ML FlexTouch Pen INJECT 12 UNITS IN THE MORNING AND 30 UNITS AT NIGHT 01/12/23  Yes Trixie File, MD  Insulin  Disposable Pump (OMNIPOD 5 G6 INTRO, GEN 5,) KIT 1 each by Does not apply route as needed. Patient not taking: Reported on 05/02/2023 08/20/22   Trixie File, MD  Insulin  Disposable Pump (OMNIPOD 5 G6 PODS, GEN 5,) MISC 1 each by Does not apply route every 3 (three) days. Patient not taking: Reported on 05/02/2023 08/20/22   Trixie File, MD  insulin  glargine-yfgn (SEMGLEE , YFGN,) 100 UNIT/ML Pen INJECT 12 UNITS IN THE MORNING AND 30 UNITS AT NIGHT 10/28/22   Trixie File, MD  [facility] VERIO test strip USE TO CHECK BLOOD SUGAR 4 TIMES A DAY 09/06/19   Trixie File, MD  tamsulosin  (FLOMAX ) 0.4 MG CAPS capsule Take 1 capsule (0.4  mg total) by mouth daily. 11/01/21   Bernardino Ditch, NP    Family History History reviewed. No pertinent family history.  Social History Social History   Tobacco Use   Smoking status: Never   Smokeless tobacco: Current    Types: Chew  Vaping Use   Vaping status: Never Used  Substance Use Topics   Alcohol use: Yes    Alcohol/week: 0.0 standard drinks of alcohol   Drug use: No     Allergies   Basaglar  kwikpen [insulin  glargine] and Humalog  [insulin  lispro]   Review of Systems Review of Systems  Constitutional:  Negative for fever.  HENT:   Positive for sore throat. Negative for congestion, ear pain and rhinorrhea.   Respiratory:  Negative for cough.      Physical Exam Triage Vital Signs ED Triage Vitals  Encounter Vitals Group     BP      Systolic BP Percentile      Diastolic BP Percentile      Pulse      Resp      Temp      Temp src      SpO2      Weight      Height      Head Circumference      Peak Flow      Pain Score      Pain Loc      Pain Education      Exclude from Growth Chart    No data found.  Updated Vital Signs BP (!) 140/90 (BP Location: Right Arm)   Pulse 100   Temp 98.8 F (37.1 C) (Oral)   Resp 17   SpO2 97%   Visual Acuity Right Eye Distance:   Left Eye Distance:   Bilateral Distance:    Right Eye Near:   Left Eye Near:    Bilateral Near:     Physical Exam Vitals and nursing note reviewed.  Constitutional:      Appearance: Normal appearance. He is not ill-appearing.  HENT:     Head: Normocephalic and atraumatic.     Mouth/Throat:     Mouth: Mucous membranes are moist.     Pharynx: Oropharyngeal exudate and posterior oropharyngeal erythema present.     Comments: Tonsillar pillars are 2+ edematous and erythematous with white exudate.  There is also edema and erythema of the uvula with white exudate at the tip. Skin:    General: Skin is warm and dry.     Capillary Refill: Capillary refill takes less than 2 seconds.     Findings: No rash.  Neurological:     General: No focal deficit present.     Mental Status: He is alert and oriented to person, place, and time.      UC Treatments / Results  Labs (all labs ordered are listed, but only abnormal results are displayed) Labs Reviewed  GROUP A STREP BY PCR - Abnormal; Notable for the following components:      Result Value   Group A Strep by PCR DETECTED (*)    All other components within normal limits    EKG   Radiology No results found.  Procedures Procedures (including critical care time)  Medications  Ordered in UC Medications - No data to display  Initial Impression / Assessment and Plan / UC Course  I have reviewed the triage vital signs and the nursing notes.  Pertinent labs & imaging results that were available during my care of the patient  were reviewed by me and considered in my medical decision making (see chart for details).   Patient is a nontoxic-appearing 38 year old male presenting for evaluation of sore throat as outlined HPI above.  His oropharyngeal exam does reveal edematous and erythematous tonsillar pillars with white exudate as well as edema of his uvula with white exudate at the tip.  I will order a strep PCR.  The patient also has an elevated blood pressure in triage of 147/105.  I will have staff recheck his blood pressure with a manual cuff.  He has no documented history of high blood pressure.  He did take TheraFlu prior to arrival which may be contributing to his elevated BP.  Blood pressure improved with manual BP.  It is now 140/90.  Strep PCR is positive.  I will discharge patient home with diagnosis of strep pharyngitis and start him on Augmentin  875 twice daily with food for 10 days for treatment of strep.  Final Clinical Impressions(s) / UC Diagnoses   Final diagnoses:  Strep pharyngitis     Discharge Instructions      Take the Augmentin  twice daily for 10 days for treatment of your strep throat.  Gargle with warm salt water 2-3 times a day to soothe your throat, aid in pain relief, and aid in healing.  Take over-the-counter ibuprofen according to the package instructions as needed for pain.  You can also use Chloraseptic or Sucrets lozenges, 1 lozenge every 2 hours as needed for throat pain.  If you develop any new or worsening symptoms return for reevaluation.      ED Prescriptions     Medication Sig Dispense Auth. Provider   amoxicillin -clavulanate (AUGMENTIN ) 875-125 MG tablet Take 1 tablet by mouth every 12 (twelve) hours for 10 days.  20 tablet Bernardino Ditch, NP      PDMP not reviewed this encounter.   Bernardino Ditch, NP 05/28/23 [phone]

## 2023-05-28 NOTE — ED Triage Notes (Addendum)
 Sore throat x 3 days Feverish  Swollen tonsils. Swollen uvula and its purple at the tip.

## 2023-06-07 ENCOUNTER — Other Ambulatory Visit: Payer: Self-pay | Admitting: Internal Medicine

## 2023-06-07 DIAGNOSIS — E103299 Type 1 diabetes mellitus with mild nonproliferative diabetic retinopathy without macular edema, unspecified eye: Secondary | ICD-10-CM

## 2023-06-07 NOTE — Telephone Encounter (Signed)
Ok to switch to lantus

## 2023-06-09 ENCOUNTER — Telehealth: Payer: Self-pay

## 2023-06-09 NOTE — Telephone Encounter (Signed)
 Urgent message has been sent to the PA team.

## 2023-06-09 NOTE — Telephone Encounter (Signed)
 Pt need a PA for [place].

## 2023-06-10 ENCOUNTER — Telehealth: Payer: Self-pay

## 2023-06-10 ENCOUNTER — Other Ambulatory Visit (HOSPITAL_COMMUNITY): Payer: Self-pay

## 2023-06-10 NOTE — Telephone Encounter (Signed)
 Pharmacy Patient Advocate Encounter   Received notification from Pt Calls Messages that prior authorization for Evaristo Bury is required/requested.   Insurance verification completed.   The patient is insured through [facility] .   Per test claim: PA required; PA started via CoverMyMeds. KEY [id number] . Waiting for clinical questions to populate.   Your PA has been resolved, no additional PA is required

## 2023-06-25 ENCOUNTER — Ambulatory Visit
Admission: EM | Admit: 2023-06-25 | Discharge: 2023-06-25 | Disposition: A | Discharge status: Home / Self Care | Attending: Family Medicine | Admitting: Family Medicine

## 2023-06-25 ENCOUNTER — Encounter: Payer: Self-pay | Admitting: Emergency Medicine

## 2023-06-25 DIAGNOSIS — J069 Acute upper respiratory infection, unspecified: Secondary | ICD-10-CM

## 2023-06-25 MED ORDER — PREDNISONE 10 MG (21) PO TBPK: ORAL_TABLET | Freq: Every day | ORAL | 0 refills | Status: DC

## 2023-06-25 MED ORDER — HYDROCOD POLI-CHLORPHE POLI ER 10-8 MG/5ML PO SUER: 5.0000 mL | Freq: Two times a day (BID) | ORAL | 0 refills | Status: AC | PRN

## 2023-06-25 NOTE — ED Provider Notes (Signed)
 [facility]    CSN: [id number] Arrival date & time: 06/25/23  1053      History   Chief Complaint Chief Complaint  Patient presents with   Cough    HPI Brian Braun is a 38 y.o. male.   HPI  History obtained from the patient. Huck presents for nasal congestion, cough and rhinorrhea that started about a week ago.  Has been taking cough medicine and Zrytec.  Has been using cough drops. Cough is dry.  No shortness of breath, fever, or chest tightness.  Denies sore throat, vomiting and diarrhea. Cough gets worse at night. His girlfriend had something similar that was treated with a liquid medicine that helped.   No history of asthma.   Past Medical History:  Diagnosis Date   Diabetes mellitus without complication (HCC)    Type I     Patient Active Problem List   Diagnosis Date Noted   Encounter for commercial driving license (CDL) exam 40/01/2724   Dyslipidemia 11/10/2017   Abnormal kidney function 03/18/2017   Type 1 diabetes mellitus with background diabetic retinopathy (HCC) 06/23/2015    Past Surgical History:  Procedure Laterality Date   NECK SURGERY  07/2011       Home Medications    Prior to Admission medications   Medication Sig Start Date End Date Taking? Authorizing Provider  chlorpheniramine-HYDROcodone (TUSSIONEX) 10-8 MG/5ML Take 5 mLs by mouth every 12 (twelve) hours as needed. 06/25/23  Yes Attallah Ontko, DO  predniSONE (STERAPRED UNI-PAK 21 TAB) 10 MG (21) TBPK tablet Take by mouth daily. Take 6 tabs by mouth daily for 1, then 5 tabs for 1 day, then 4 tabs for 1 day, then 3 tabs for 1 day, then 2 tabs for 1 day, then 1 tab for 1 day. 06/25/23  Yes Meridian Scherger, DO  ACCU-CHEK SOFTCLIX LANCETS lancets USE TO TEST BLOOD SUGAR 4 TIMES A DAY 11/15/16   Carlus Pavlov, MD  BD PEN NEEDLE MICRO U/F 32G X 6 MM MISC USE 4 TIMES A DAY 03/11/23   Carlus Pavlov, MD  Continuous Glucose Sensor (FREESTYLE LIBRE 3 SENSOR) MISC 1 EACH BY DOES  NOT APPLY ROUTE EVERY 14 (FOURTEEN) DAYS. 01/05/23   Carlus Pavlov, MD  Continuous Glucose Transmitter (DEXCOM G6 TRANSMITTER) MISC 1 Device by Does not apply route every 3 (three) months. Patient not taking: Reported on 05/02/2023 08/20/22   Carlus Pavlov, MD  Glucagon 3 MG/DOSE POWD Place 3 mg into the nose once as needed for up to 1 dose. 02/12/21   Carlus Pavlov, MD  insulin aspart (FIASP FLEXTOUCH) 100 UNIT/ML FlexTouch Pen INJECT UNDER SKIN 8-14 UNITS BEFORE MEALS 04/10/23   Carlus Pavlov, MD  Insulin Aspart, w/Niacinamide, (FIASP) 100 UNIT/ML SOLN Use up to 80 units daily in the insulin pump Patient not taking: Reported on 05/02/2023 08/20/22   Carlus Pavlov, MD  Insulin Degludec FlexTouch 200 UNIT/ML SOPN INJECT 12 UNITS IN THE MORNING AND 30 UNITS AT NIGHT 06/14/23   Carlus Pavlov, MD  Insulin Disposable Pump (OMNIPOD 5 G6 INTRO, GEN 5,) KIT 1 each by Does not apply route as needed. Patient not taking: Reported on 05/02/2023 08/20/22   Carlus Pavlov, MD  Insulin Disposable Pump (OMNIPOD 5 G6 PODS, GEN 5,) MISC 1 each by Does not apply route every 3 (three) days. Patient not taking: Reported on 05/02/2023 08/20/22   Carlus Pavlov, MD  insulin glargine-yfgn (SEMGLEE, YFGN,) 100 UNIT/ML Pen INJECT 12 UNITS IN THE MORNING AND 30 UNITS AT  NIGHT 10/28/22   Carlus Pavlov, MD  ONETOUCH VERIO test strip USE TO CHECK BLOOD SUGAR 4 TIMES A DAY 09/06/19   Carlus Pavlov, MD  tamsulosin (FLOMAX) 0.4 MG CAPS capsule Take 1 capsule (0.4 mg total) by mouth daily. 11/01/21   Becky Augusta, NP    Family History History reviewed. No pertinent family history.  Social History Social History   Tobacco Use   Smoking status: Never   Smokeless tobacco: Current    Types: Chew  Vaping Use   Vaping status: Never Used  Substance Use Topics   Alcohol use: Yes    Alcohol/week: 0.0 standard drinks of alcohol   Drug use: No     Allergies   Basaglar kwikpen [insulin glargine] and  Humalog [insulin lispro]   Review of Systems Review of Systems: negative unless otherwise stated in HPI.      Physical Exam Triage Vital Signs ED Triage Vitals  Encounter Vitals Group     BP 06/25/23 1100 (!) 141/94     Systolic BP Percentile --      Diastolic BP Percentile --      Pulse Rate 06/25/23 1100 85     Resp 06/25/23 1100 15     Temp 06/25/23 1100 98.3 F (36.8 C)     Temp Source 06/25/23 1100 Oral     SpO2 06/25/23 1100 96 %     Weight 06/25/23 1058 175 lb 4.3 oz (79.5 kg)     Height 06/25/23 1058 5\' 5"  (1.651 m)     Head Circumference --      Peak Flow --      Pain Score 06/25/23 1058 0     Pain Loc --      Pain Education --      Exclude from Growth Chart --    No data found.  Updated Vital Signs BP (!) 141/94 (BP Location: Right Arm)   Pulse 85   Temp 98.3 F (36.8 C) (Oral)   Resp 15   Ht 5\' 5"  (1.651 m)   Wt 79.5 kg   SpO2 96%   BMI 29.17 kg/m   Visual Acuity Right Eye Distance:   Left Eye Distance:   Bilateral Distance:    Right Eye Near:   Left Eye Near:    Bilateral Near:     Physical Exam GEN:     alert, non-toxic appearing male in no distress    HENT:  mucus membranes moist, no visible nasal discharge EYES:   no scleral injection or discharge RESP:  no increased work of breathing, clear to auscultation bilaterally CVS:   regular rate and rhythm Skin:   warm and dry    UC Treatments / Results  Labs (all labs ordered are listed, but only abnormal results are displayed) Labs Reviewed - No data to display  EKG   Radiology No results found.  Procedures Procedures (including critical care time)  Medications Ordered in UC Medications - No data to display  Initial Impression / Assessment and Plan / UC Course  I have reviewed the triage vital signs and the nursing notes.  Pertinent labs & imaging results that were available during my care of the patient were reviewed by me and considered in my medical decision making (see  chart for details).       Pt is a 38 y.o. male who presents for 5 days of respiratory symptoms. Brian Braun is afebrile here without recent antipyretics. Satting well on room air. Overall pt is non-toxic  appearing, well hydrated, without respiratory distress. Pulmonary exam is unremarkable.  COVID and influenza testing deferred due to duration of symptoms. and was negative.  History most consistent with viral respiratory illness. Discussed symptomatic treatment.  Explained lack of efficacy of antibiotics in viral disease.  Prednisone and Tussionex prescribed. Typical duration of symptoms discussed.   Return and ED precautions given and voiced understanding. Discussed MDM, treatment plan and plan for follow-up with patient who agrees with plan.     Final Clinical Impressions(s) / UC Diagnoses   Final diagnoses:  URI with cough and congestion     Discharge Instructions      Stop by the pharmacy to pick up your prescriptions.  Follow up with your primary care provider or return to the urgent care, if not improving.   Be sure to monitor your blood sugars while taking steroids.       ED Prescriptions     Medication Sig Dispense Auth. Provider   chlorpheniramine-HYDROcodone (TUSSIONEX) 10-8 MG/5ML Take 5 mLs by mouth every 12 (twelve) hours as needed. 115 mL Jisell Majer, DO   predniSONE (STERAPRED UNI-PAK 21 TAB) 10 MG (21) TBPK tablet Take by mouth daily. Take 6 tabs by mouth daily for 1, then 5 tabs for 1 day, then 4 tabs for 1 day, then 3 tabs for 1 day, then 2 tabs for 1 day, then 1 tab for 1 day. 21 tablet Malena Timpone, DO      I have reviewed the PDMP during this encounter.   Katha Cabal, DO 06/25/23 1212

## 2023-06-25 NOTE — ED Triage Notes (Signed)
Patient c/o cough and chest congestion for a week.  Patient denies fevers.  °

## 2023-06-25 NOTE — Discharge Instructions (Addendum)
 Stop by the pharmacy to pick up your prescriptions.  Follow up with your primary care provider or return to the urgent care, if not improving.   Be sure to monitor your blood sugars while taking steroids.

## 2023-08-07 ENCOUNTER — Other Ambulatory Visit: Payer: Self-pay | Admitting: Internal Medicine

## 2023-08-07 DIAGNOSIS — E103299 Type 1 diabetes mellitus with mild nonproliferative diabetic retinopathy without macular edema, unspecified eye: Secondary | ICD-10-CM

## 2023-08-16 LAB — HM DIABETES EYE EXAM

## 2023-08-18 ENCOUNTER — Ambulatory Visit: Payer: BLUE CROSS/BLUE SHIELD | Admitting: Internal Medicine

## 2023-08-18 NOTE — Progress Notes (Deleted)
 Patient ID: DEJEAN DOSTAL, male   DOB: 1986/02/09, 38 y.o.   MRN: [id number]   HPI: Jenine Mix is a 38 y.o.-year-old male, returning for f/u for DM1, dx 1996, uncontrolled, with long-term complications (DR). Last visit 4 months ago.  He now has a new insurance: [employer].  Interim hx: He denies increased urination, blurry vision, nausea, chest pain. 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 They are less busy during the winter.  During the summer, he is also mowing and sugars are usually better.  Reviewed HbA1c levels: Lab Results  Component Value Date   HGBA1C 6.8 (A) 05/02/2023   HGBA1C 6.9 (A) 12/27/2022   HGBA1C 6.7 (A) 08/20/2022   HGBA1C 7.0 (A) 05/04/2022   HGBA1C 6.6 (A) 12/29/2021   HGBA1C 6.7 (A) 08/18/2021   HGBA1C 7.7 (A) 06/18/2021   HGBA1C 7.7 (A) 02/12/2021   HGBA1C 7.0 (A) 10/09/2020   HGBA1C 8.5 (A) 06/05/2020   HGBA1C 7.6 (A) 01/29/2020   HGBA1C 7.7 (A) 09/27/2019   HGBA1C 7.6 (A) 12/11/2018   HGBA1C 7.8 (A) 07/13/2018   HGBA1C 7.5 (A) 03/13/2018   HGBA1C 7.6 (A) 11/10/2017   HGBA1C 7.1 07/14/2017   HGBA1C 7.4 03/18/2017   HGBA1C 6.8 11/15/2016   HGBA1C 8.7 07/28/2016  01/2019: work: HbA1c 7.0% 01/2018: HbA1c (work): 6.8% (Novolog ) 03/25/2016: HbA1c 6.2% 10/11/2013: 7.6% 06/13/2013: 7.3%  He was previously on the Medtronic MiniMed insulin  pump up to 2011, but he developed DKA episodes and afterwards, he was not interested in restarting the pump.    He is on: - Lantus  40-42 >> ... >> Semglee  16 >> 12 units in am and 26 >> 30 units at night >> Tresiba  U200 12 units in a.m. and 30 units at night >> 42 units daily - Humalog  >> FiAsp :  10-14 units before meals + sliding scale Target 150 ISF 30 >> ISF 50 This means: - 150-200: + 1 units  - 201-250: + 2 units  - >250: + 3 units   Glucometer: Accu-Chek expert meter >> One Touch Verio He continues on Humalog , which is not as efficient  for him as NovoLog , but he had a very high co-pay for NovoLog  ($600) while for Humalog  he only has to pay $25.  Pt checks his sugars >4 times a day with his CGM:   Previously:  Previously:  Lowest sugar was 40s >> 40s >> 50s;  he has hypoglycemia awareness in the 60s. He has an unexpired glucagon  kit at home. Highest sugar was 471 >> .Aaron AasAaron Aas 400s >> 400. He had one DKA admission in 2011.  Pt's meals are: - Breakfast: oatmeal or bacon and eggs + cheese biscuit - Lunch: sandwich or hamburger - Dinner: grilled chicken and rice; spaghetti; sloppy joes - Snacks:PB crackers at 10 am; cheeze it's, drinks Gatorade  No history of CKD, last BUN/creatinine: Lab Results  Component Value Date   BUN 8 08/20/2022   CREATININE 1.28 (H) 08/20/2022   Lab Results  Component Value Date   GFR 68.68 06/18/2021   GFR 59.65 (L) 09/27/2019   GFR 54.44 (L) 03/25/2015   GFR 78.07 04/09/2014   No MAU: Lab Results  Component Value Date   MICRALBCREAT 16 08/20/2022   MICRALBCREAT 0.8 10/09/2020   MICRALBCREAT 1.0 09/27/2019   MICRALBCREAT 1.2 03/13/2018   MICRALBCREAT 1.3 03/18/2017   He has a history of dyslipidemia but latest lipid panel was at goal: Lab Results  Component Value Date   CHOL 134 08/20/2022   HDL 49 08/20/2022   LDLCALC 69 08/20/2022   TRIG 76 08/20/2022   CHOLHDL 2.7 08/20/2022   Latest TSH was normal: Lab Results  Component Value Date   TSH 1.23 08/20/2022   - last eye exam was 09/03/2022: + DR.   - no numbness and tingling in his feet.  Foot exam: 05/02/2023.  ROS: + see HPI  I reviewed pt's medications, allergies, PMH, social hx, family hx, and changes were documented in the history of present illness. Otherwise, unchanged from my initial visit note.  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 115 mL 0   Continuous Glucose Sensor (FREESTYLE LIBRE 3 SENSOR) MISC 1 EACH BY DOES NOT APPLY ROUTE EVERY 14 (FOURTEEN) DAYS. 2 each 26   Continuous Glucose Transmitter (DEXCOM G6 TRANSMITTER) MISC 1 Device by Does not apply route every 3 (three) months. (Patient not taking: Reported on 05/02/2023) 1 each 3   Glucagon  3 MG/DOSE POWD Place 3 mg into the nose once as needed for up to 1 dose. 1 each 11   insulin  aspart (FIASP  FLEXTOUCH) 100 UNIT/ML FlexTouch Pen INJECT UNDER SKIN 8-14 UNITS BEFORE MEALS 15 mL 2   Insulin  Aspart, w/Niacinamide, (FIASP ) 100 UNIT/ML SOLN Use up to 80 units daily in the insulin  pump (Patient not taking: Reported on 05/02/2023) 30 mL 11   Insulin  Degludec FlexTouch 200 UNIT/ML SOPN INJECT 12 UNITS IN THE MORNING AND 30 UNITS AT NIGHT 27 mL 3   Insulin  Disposable Pump (OMNIPOD 5 G6 INTRO, GEN 5,) KIT 1 each by Does not apply route as needed. (Patient not taking: Reported on 05/02/2023) 1 kit 0   Insulin  Disposable Pump (OMNIPOD 5 G6 PODS, GEN 5,) MISC 1 each by Does not apply route every 3 (three) days. (Patient not taking: Reported on 05/02/2023) 30 each 3   insulin  glargine-yfgn  (SEMGLEE , YFGN,) 100 UNIT/ML Pen INJECT 12 UNITS IN THE MORNING AND 30 UNITS AT NIGHT 45 mL 3   ONETOUCH VERIO test strip USE TO CHECK BLOOD SUGAR 4 TIMES A DAY 400 strip 12   predniSONE  (STERAPRED UNI-PAK 21 TAB) 10 MG (21) TBPK tablet Take by mouth daily. Take 6 tabs by mouth daily for 1, then 5 tabs for 1 day, then  4 tabs for 1 day, then 3 tabs for 1 day, then 2 tabs for 1 day, then 1 tab for 1 day. 21 tablet 0   tamsulosin  (FLOMAX ) 0.4 MG CAPS capsule Take 1 capsule (0.4 mg total) by mouth daily. 30 capsule 0   No current facility-administered medications for this visit.   Allergies  Allergen Reactions   Basaglar  Kwikpen [Insulin  Glargine] Other (See Comments)    Ineffective   Humalog  [Insulin  Lispro] Other (See Comments)    Ineffective   Family history: None  PE: There were no vitals taken for this visit.  Wt Readings from Last 3 Encounters:  06/25/23 175 lb 4.3 oz (79.5 kg)  05/02/23 175 lb 3.2 oz (79.5 kg)  12/27/22 172 lb 12.8 oz (78.4 kg)   Constitutional: overweight, in NAD Eyes: no exophthalmos ENT: no masses palpated in neck, no cervical lymphadenopathy Cardiovascular: tachycardia, RR, No MRG Respiratory: CTA B Musculoskeletal: no deformities Skin:  no rashes Neurological: no tremor with outstretched hands   ASSESSMENT: 1. DM1, uncontrolled, with complications (background DR)  -He keeps his blood sugars higher during the summer on purpose, as he is more active, mowing -Per  records from Dr. Lorelei Rogers, patient's diabetes was never very uncontrolled, his range is 7.2-7.6%. I suspect that he still has some insulin  production from his pancreas.  -He was not interested in an insulin  pump.  2. Dyslipidemia  PLAN:  1. Patient with longstanding uncontrolled, type I, on basal-bolus insulin  regimen, with large variability in his blood sugars throughout the day, but improved diabetes control at last visit.  At that time, HbA1c was 6.8%.  He was taking Tresiba  and 2 different  daily doses and I advised him to take them together.  Also, we again discussed about the importance of taking Fiasp  before his meals, which she was trying to do with most meals even sometimes 15 to 30 minutes before eating.  He was having multiple snacks (Cheeze its) during the day, which I advised him to stop.  We previously discussed about not over correcting higher blood sugars which could be the reason for his significant drops after an initial hyperglycemia after meals.  I again encouraged him to look into the OmniPod insulin  pump and see if this was covered for him. CGM interpretation: -At today's visit, we reviewed his CGM downloads: It appears that *** of values are in target range (goal >70%), while *** are higher than 180 (goal <25%), and *** are lower than 70 (goal <4%).  The calculated average blood sugar is ***.  The projected HbA1c for the next 3 months (GMI) is ***. -Reviewing the CGM trends, ***  - I advised him to:  Patient Instructions  Please continue - Tresiba  U200 42 units in am - FiAsp : 10-14 units before meals + sliding scale Target 150 ISF 50 This means: - 150-200: + 1 units  - 201-250: + 2 units  - >250: + 3 units  When you correct high blood sugars, try to use less than 3 units of insulin .   Check with your insurance if we can use Omnipod 5 + Dexcom CGM.   Please come back for a follow-up appointment in 3-4 months.  - we checked his HbA1c: 7%  - advised to check sugars at different times of the day - 4x a day, rotating check times - advised for yearly eye exams >> he is UTD - return to clinic in 3-4 months   2.  Dyslipidemia - Latest lipid panel was  at goal: Lab Results  Component Value Date   CHOL 134 08/20/2022   HDL 49 08/20/2022   LDLCALC 69 08/20/2022   TRIG 76 08/20/2022   CHOLHDL 2.7 08/20/2022  - He is not on a statin  Delmas Faucett, MD PhD [facility] Endocrinology

## 2023-08-22 ENCOUNTER — Encounter: Payer: Self-pay | Admitting: Internal Medicine

## 2023-08-22 ENCOUNTER — Ambulatory Visit: Admitting: Internal Medicine

## 2023-08-22 VITALS — BP 120/80 | HR 87 | Ht 65.0 in | Wt 178.8 lb

## 2023-08-22 DIAGNOSIS — E785 Hyperlipidemia, unspecified: Secondary | ICD-10-CM

## 2023-08-22 DIAGNOSIS — E103299 Type 1 diabetes mellitus with mild nonproliferative diabetic retinopathy without macular edema, unspecified eye: Secondary | ICD-10-CM

## 2023-08-22 LAB — POCT GLYCOSYLATED HEMOGLOBIN (HGB A1C): Hemoglobin A1C: 5.4 % (ref 4.0–5.6)

## 2023-08-22 NOTE — Patient Instructions (Addendum)
 Please decrease: - Tresiba  U200 34 units in am  Continue: - FiAsp : 10-14 units before meals + sliding scale Target 150 ISF 50 This means: - 150-200: + 1 units  - 201-250: + 2 units  - >250: + 3 units   Try not correct high blood sugars after meals.   Check with your insurance if we can use Omnipod 5 + Dexcom CGM.   Please come back for a follow-up appointment in 3-4 months.

## 2023-08-22 NOTE — Progress Notes (Signed)
 Patient ID: Brian Braun, male   DOB: 12/21/1985, 38 y.o.   MRN: [id number]  This note was precharted 08/18/2023.  HPI: Brian Braun is a 38 y.o.-year-old male, returning for f/u for DM1, dx 1996, uncontrolled, with long-term complications (DR). Last visit 4 months ago.  He now has a new insurance: [employer].  Interim hx: He denies increased urination, blurry vision, nausea, chest pain. 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 They are less busy during the winter.  During the summer, he is also mowing and sugars are usually better.  Reviewed HbA1c levels: Lab Results  Component Value Date   HGBA1C 6.8 (A) 05/02/2023   HGBA1C 6.9 (A) 12/27/2022   HGBA1C 6.7 (A) 08/20/2022   HGBA1C 7.0 (A) 05/04/2022   HGBA1C 6.6 (A) 12/29/2021   HGBA1C 6.7 (A) 08/18/2021   HGBA1C 7.7 (A) 06/18/2021   HGBA1C 7.7 (A) 02/12/2021   HGBA1C 7.0 (A) 10/09/2020   HGBA1C 8.5 (A) 06/05/2020   HGBA1C 7.6 (A) 01/29/2020   HGBA1C 7.7 (A) 09/27/2019   HGBA1C 7.6 (A) 12/11/2018   HGBA1C 7.8 (A) 07/13/2018   HGBA1C 7.5 (A) 03/13/2018   HGBA1C 7.6 (A) 11/10/2017   HGBA1C 7.1 07/14/2017   HGBA1C 7.4 03/18/2017   HGBA1C 6.8 11/15/2016   HGBA1C 8.7 07/28/2016  01/2019: work: HbA1c 7.0% 01/2018: HbA1c (work): 6.8% (Novolog ) 03/25/2016: HbA1c 6.2% 10/11/2013: 7.6% 06/13/2013: 7.3%  He was previously on the Medtronic MiniMed insulin  pump up to 2011, but he developed DKA episodes and afterwards, he was not interested in restarting the pump.    He is on: - Lantus  40-42 >> ... >> Semglee  16 >> 12 units in am and 26 >> 30 units at night >> Tresiba  U200 12 units in a.m. and 30 units at night >> 42 units daily - Humalog  >> FiAsp :  10-14 units before meals + sliding scale Target 150 ISF 30 >> ISF 50 This means: - 150-200: + 1 units  - 201-250: + 2 units  - >250: + 3 units   Glucometer: Accu-Chek expert meter >> One Touch Verio He continues on  Humalog , which is not as efficient for him as NovoLog , but he had a very high co-pay for NovoLog  ($600) while for Humalog  he only has to pay $25.  Pt checks his sugars >4 times a day with his CGM:    Previously:  Previously:  Lowest sugar was 40s >> 40s >> 50s >> 40s;  he has hypoglycemia awareness in the 60s. He has an unexpired glucagon  kit at home. Highest sugar was 471 >> .Brian AasAaron Braun 400s >> 400 >> HI. He had one DKA admission in 2011.  Pt's meals are: - Breakfast: oatmeal or bacon and eggs + cheese biscuit - Lunch: sandwich or hamburger - Dinner: grilled chicken and rice; spaghetti; sloppy joes - Snacks:PB crackers at 10 am; cheeze it's, drinks Gatorade  No history of CKD, last BUN/creatinine: Lab Results  Component Value Date   BUN 8 08/20/2022   CREATININE 1.28 (H) 08/20/2022   Lab Results  Component Value Date   GFR 68.68 06/18/2021   GFR 59.65 (L) 09/27/2019   GFR 54.44 (L) 03/25/2015   GFR 78.07 04/09/2014   No MAU: Lab Results  Component Value Date   MICRALBCREAT 16 08/20/2022   MICRALBCREAT 0.8 10/09/2020   MICRALBCREAT 1.0 09/27/2019   MICRALBCREAT 1.2 03/13/2018   MICRALBCREAT 1.3 03/18/2017   He has a history of dyslipidemia  but latest lipid panel was at goal: Lab Results  Component Value Date   CHOL 134 08/20/2022   HDL 49 08/20/2022   LDLCALC 69 08/20/2022   TRIG 76 08/20/2022   CHOLHDL 2.7 08/20/2022   Latest TSH was normal: Lab Results  Component Value Date   TSH 1.23 08/20/2022   - last eye exam was 08/15/2023: + DR reportedly.   - no numbness and tingling in his feet.  Foot exam: 05/02/2023.  ROS: + see HPI  I reviewed pt's medications, allergies, PMH, social hx, family hx, and changes were documented in the history of present illness. Otherwise, unchanged from my initial visit note.  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 115 mL 0   Continuous Glucose Sensor (FREESTYLE LIBRE 3 SENSOR) MISC 1 EACH BY DOES NOT APPLY ROUTE EVERY 14 (FOURTEEN) DAYS. 2 each 26   Continuous Glucose Transmitter (DEXCOM G6 TRANSMITTER) MISC 1 Device by Does not apply route every 3 (three) months. (Patient not taking: Reported on 05/02/2023) 1 each 3   Glucagon  3 MG/DOSE POWD Place 3 mg into the nose once as needed for up to 1 dose. 1 each 11   insulin  aspart (FIASP  FLEXTOUCH) 100 UNIT/ML FlexTouch Pen INJECT UNDER SKIN 8-14 UNITS BEFORE MEALS 15 mL 2   Insulin  Aspart, w/Niacinamide, (FIASP ) 100 UNIT/ML SOLN Use up to 80 units daily in the insulin  pump (Patient not taking: Reported on 05/02/2023) 30 mL 11   Insulin  Degludec FlexTouch 200 UNIT/ML SOPN INJECT 12 UNITS IN THE MORNING AND 30 UNITS AT NIGHT 27 mL 3   Insulin  Disposable Pump (OMNIPOD 5 G6 INTRO, GEN 5,) KIT 1 each by Does not apply route as needed. (Patient not taking: Reported on 05/02/2023) 1 kit 0   Insulin  Disposable Pump (OMNIPOD 5 G6 PODS, GEN 5,) MISC 1 each by Does not apply route every 3 (three) days. (Patient not taking:  Reported on 05/02/2023) 30 each 3   insulin  glargine-yfgn (SEMGLEE , YFGN,) 100 UNIT/ML Pen INJECT 12 UNITS IN THE MORNING AND 30 UNITS AT NIGHT 45 mL 3   ONETOUCH VERIO test strip USE TO CHECK BLOOD SUGAR 4 TIMES A DAY 400 strip 12   predniSONE  (STERAPRED UNI-PAK 21 TAB) 10 MG (21) TBPK tablet Take by mouth daily. Take 6 tabs by  mouth daily for 1, then 5 tabs for 1 day, then 4 tabs for 1 day, then 3 tabs for 1 day, then 2 tabs for 1 day, then 1 tab for 1 day. 21 tablet 0   tamsulosin  (FLOMAX ) 0.4 MG CAPS capsule Take 1 capsule (0.4 mg total) by mouth daily. 30 capsule 0   No current facility-administered medications for this visit.   Allergies  Allergen Reactions   Basaglar  Kwikpen [Insulin  Glargine] Other (See Comments)    Ineffective   Humalog  [Insulin  Lispro] Other (See Comments)    Ineffective   Family history: None  PE: BP 120/80   Pulse 87   Ht 5\' 5"  (1.651 m)   Wt 178 lb 12.8 oz (81.1 kg)   SpO2 97%   BMI 29.75 kg/m   Wt Readings from Last 3 Encounters:  08/22/23 178 lb 12.8 oz (81.1 kg)  06/25/23 175 lb 4.3 oz (79.5 kg)  05/02/23 175 lb 3.2 oz (79.5 kg)   Constitutional: overweight, in NAD Eyes: no exophthalmos ENT: no masses palpated in neck, no cervical lymphadenopathy Cardiovascular: tachycardia, RR, No MRG Respiratory: CTA B Musculoskeletal: no deformities Skin:  no rashes Neurological: no tremor with outstretched hands   ASSESSMENT: 1. DM1, uncontrolled, with complications (background DR)  -He keeps his blood sugars higher during the summer on purpose, as he is more active, mowing -Per  records from Dr. Lorelei Rogers, patient's diabetes was never very uncontrolled, his range is 7.2-7.6%. I suspect that he still has some insulin  production from his pancreas.  -He was not interested in an insulin  pump.  2. Dyslipidemia  PLAN:  1. Patient with longstanding uncontrolled, type I, on basal-bolus insulin  regimen, with large variability in his blood sugars throughout the  day, but improved diabetes control at last visit.  At that time, HbA1c was 6.8%.  He was taking Tresiba  and 2 different daily doses and I advised him to take them together.  Also, we again discussed about the importance of taking Fiasp  before his meals, which she was trying to do with most meals even sometimes 15 to 30 minutes before eating.  He was having multiple snacks (Cheeze its) during the day, which I advised him to stop.  We previously discussed about not over correcting higher blood sugars which could be the reason for his significant drops after an initial hyperglycemia after meals.  I again encouraged him to look into the OmniPod insulin  pump and see if this was covered for him. CGM interpretation: -At today's visit, we reviewed his CGM downloads: It appears that 72% of values are in target range (goal >70%), while 15% are higher than 180 (goal <25%), and 13% are lower than 70 (goal <4%).  The calculated average blood sugar is 125.  The projected HbA1c for the next 3 months (GMI) is 6.3%. -Reviewing the CGM trends, sugars are now fluctuating mostly within the target range but he does have hyperglycemic spikes after lunch, dinner, and also some very high blood sugars during the night.  He also has many instances of low blood sugars throughout the day.  Today's visit discussed about reducing the dose of his Tresiba  especially as he is also planning to be more active now during the summer.  He is still having some highs after meals, but less than before.  Many times he is correcting the highs after meals with sliding scale.  If sugars are dropping abruptly afterwards.  We discussed about trying not to correct these higher blood sugars after meals  if possible.  He did not hear from his insurance whether the pump is covered.  I advised him to continue to try to see. - I advised him to:  Patient Instructions  Please decrease: - Tresiba  U200 34 units in am  Continue: - FiAsp : 10-14 units before meals  + sliding scale Target 150 ISF 50 This means: - 150-200: + 1 units  - 201-250: + 2 units  - >250: + 3 units   Try not correct high blood sugars after meals.   Check with your insurance if we can use Omnipod 5 + Dexcom CGM.   Please come back for a follow-up appointment in 3-4 months.  - we checked his HbA1c: 5.4% (lower) - advised to check sugars at different times of the day - 4x a day, rotating check times - advised for yearly eye exams >> he is UTD - he called the ophthalmology office today to have the records faxed over to us  so I can fill out his driving papers - will check annual labs today - return to clinic in 3-4 months   2.  Dyslipidemia - Reviewed latest lipid panel from 1 year ago and this was at goal: Lab Results  Component Value Date   CHOL 134 08/20/2022   HDL 49 08/20/2022   LDLCALC 69 08/20/2022   TRIG 76 08/20/2022   CHOLHDL 2.7 08/20/2022  - He is not on a statin - He is due for another lipid panel-will check today  Orders Placed This Encounter  Procedures   TSH   Microalbumin / creatinine urine ratio   Lipid Panel w/reflex Direct LDL   Comprehensive metabolic panel with GFR   POCT glycosylated hemoglobin (Hb A1C)   Emilie Harden, MD PhD [facility] Endocrinology

## 2023-08-23 ENCOUNTER — Encounter: Payer: Self-pay | Admitting: Internal Medicine

## 2023-08-23 LAB — LIPID PANEL W/REFLEX DIRECT LDL
Cholesterol: 128 mg/dL (ref <200)
HDL: 38 mg/dL — ABNORMAL LOW (ref >=40)
LDL Cholesterol (Calc): 74 mg/dL
Non-HDL Cholesterol (Calc): 90 mg/dL (ref <130)
Total CHOL/HDL Ratio: 3.4 (calc) (ref <5.0)
Triglycerides: 75 mg/dL (ref <150)

## 2023-08-23 LAB — COMPREHENSIVE METABOLIC PANEL WITH GFR
AG Ratio: 1.6 (calc) (ref 1.0–2.5)
ALT: 16 U/L (ref 9–46)
AST: 18 U/L (ref 10–40)
Albumin: 4.4 g/dL (ref 3.6–5.1)
Alkaline phosphatase (APISO): 78 U/L (ref 36–130)
BUN/Creatinine Ratio: 8 (calc) (ref 6–22)
BUN: 11 mg/dL (ref 7–25)
CO2: 31 mmol/L (ref 20–32)
Calcium: 9.6 mg/dL (ref 8.6–10.3)
Chloride: 100 mmol/L (ref 98–110)
Creat: 1.38 mg/dL — ABNORMAL HIGH (ref 0.60–1.26)
Globulin: 2.8 g/dL (ref 1.9–3.7)
Glucose, Bld: 176 mg/dL — ABNORMAL HIGH (ref 65–99)
Potassium: 4.6 mmol/L (ref 3.5–5.3)
Sodium: 137 mmol/L (ref 135–146)
Total Bilirubin: 1.2 mg/dL (ref 0.2–1.2)
Total Protein: 7.2 g/dL (ref 6.1–8.1)
eGFR: 67 mL/min/{1.73_m2} (ref >=60)

## 2023-08-23 LAB — MICROALBUMIN / CREATININE URINE RATIO
Creatinine, Urine: 94 mg/dL (ref 20–320)
Microalb Creat Ratio: 14 mg/g{creat} (ref <30)
Microalb, Ur: 1.3 mg/dL

## 2023-08-23 LAB — TSH: TSH: 1.91 m[IU]/L (ref 0.40–4.50)

## 2023-12-11 ENCOUNTER — Other Ambulatory Visit: Payer: Self-pay | Admitting: Internal Medicine

## 2023-12-11 DIAGNOSIS — E103299 Type 1 diabetes mellitus with mild nonproliferative diabetic retinopathy without macular edema, unspecified eye: Secondary | ICD-10-CM

## 2024-01-12 ENCOUNTER — Encounter: Payer: Self-pay | Admitting: Internal Medicine

## 2024-01-12 ENCOUNTER — Other Ambulatory Visit: Payer: Self-pay | Admitting: Internal Medicine

## 2024-01-12 ENCOUNTER — Ambulatory Visit: Admitting: Internal Medicine

## 2024-01-12 VITALS — BP 122/80 | HR 83 | Resp 16 | Ht 65.0 in | Wt 179.4 lb

## 2024-01-12 DIAGNOSIS — E103299 Type 1 diabetes mellitus with mild nonproliferative diabetic retinopathy without macular edema, unspecified eye: Secondary | ICD-10-CM | POA: Diagnosis not present

## 2024-01-12 DIAGNOSIS — E785 Hyperlipidemia, unspecified: Secondary | ICD-10-CM | POA: Diagnosis not present

## 2024-01-12 LAB — POCT GLYCOSYLATED HEMOGLOBIN (HGB A1C): Hemoglobin A1C: 6.4 % — AB (ref 4.0–5.6)

## 2024-01-12 MED ORDER — FREESTYLE LIBRE 3 PLUS SENSOR MISC: 1.0000 | 3 refills | Status: AC

## 2024-01-12 MED ORDER — INSULIN DEGLUDEC FLEXTOUCH 200 UNIT/ML ~~LOC~~ SOPN: PEN_INJECTOR | SUBCUTANEOUS | Status: DC

## 2024-01-12 NOTE — Patient Instructions (Addendum)
 Please decrease: - Tresiba  U200 30 units daily  Use: - FiAsp : 10-14 units before meals + sliding scale Target 150 ISF 50 This means: - 150-200: + 1 units  - 201-250: + 2 units  - >250: + 3 units   Try not correct high blood sugars after meals.   Check with your insurance if we can use the iLet pump. If not, we can use the t:slim X2, mobi, Omnipod 5.   Please come back for a follow-up appointment in 3-4 months.

## 2024-01-12 NOTE — Progress Notes (Signed)
 Patient ID: Brian Braun, male   DOB: 1985/12/20, 38 y.o.   MRN: [id number]   HPI: Brian Braun is a 38 y.o.-year-old male, returning for f/u for DM1, dx 1996, uncontrolled, with long-term complications (DR). Last visit 4.5 months ago.  He now has a new insurance: [employer].  Interim hx: He denies increased urination, blurry vision, nausea, chest pain. 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  However, he does not 3 weeks, he has been working 16 hours days, and has been very busy.  Reviewed HbA1c levels: Lab Results  Component Value Date   HGBA1C 5.4 08/22/2023   HGBA1C 6.8 (A) 05/02/2023   HGBA1C 6.9 (A) 12/27/2022   HGBA1C 6.7 (A) 08/20/2022   HGBA1C 7.0 (A) 05/04/2022   HGBA1C 6.6 (A) 12/29/2021   HGBA1C 6.7 (A) 08/18/2021   HGBA1C 7.7 (A) 06/18/2021   HGBA1C 7.7 (A) 02/12/2021   HGBA1C 7.0 (A) 10/09/2020   HGBA1C 8.5 (A) 06/05/2020   HGBA1C 7.6 (A) 01/29/2020   HGBA1C 7.7 (A) 09/27/2019   HGBA1C 7.6 (A) 12/11/2018   HGBA1C 7.8 (A) 07/13/2018   HGBA1C 7.5 (A) 03/13/2018   HGBA1C 7.6 (A) 11/10/2017   HGBA1C 7.1 07/14/2017   HGBA1C 7.4 03/18/2017   HGBA1C 6.8 11/15/2016  01/2019: work: HbA1c 7.0% 01/2018: HbA1c (work): 6.8% (Novolog ) 03/25/2016: HbA1c 6.2% 10/11/2013: 7.6% 06/13/2013: 7.3%  He was previously on the Medtronic MiniMed insulin  pump up to 2011, but he developed DKA episodes and afterwards, he was not interested in restarting the pump.    He is on: - Lantus  40-42 >> ... >> Semglee  16 >> ... Tresiba  U200 12 units in a.m. and 30 units in hs >> 42 >> 34 units daily - Humalog  >> FiAsp :  10-14 units before meals + sliding scale Target 150 ISF 30 >> ISF 50 This means: - 150-200: + 1 units  - 201-250: + 2 units  - >250: + 3 units   Glucometer: Accu-Chek expert meter >> One Touch Verio He continues on Humalog , which is not as efficient for him as NovoLog , but he had a very high co-pay for NovoLog  ($600) while for Humalog   he only has to pay $25.  Pt checks his sugars >4 times a day with his CGM:  Previously:    Previously:   Lowest sugar was 50s >> 40s;  he has hypoglycemia awareness in the 60s. He has an unexpired glucagon  kit at home. Highest sugar was 471 >> .SABRASABRA 400 >> HI. He had one DKA admission in 2011.  Pt's meals are: - Breakfast: oatmeal or bacon and eggs + cheese biscuit - Lunch: sandwich or hamburger - Dinner: grilled chicken and rice; spaghetti; sloppy joes - Snacks:PB crackers at 10 am; cheeze it's, drinks Gatorade  + CKD, last BUN/creatinine: Lab Results  Component Value Date   BUN 11 08/22/2023   CREATININE 1.38 (H) 08/22/2023   Lab Results  Component Value Date   GFR 68.68 06/18/2021   GFR 59.65 (L) 09/27/2019   GFR 54.44 (L) 03/25/2015   GFR 78.07 04/09/2014   No MAU: Lab Results  Component Value Date   MICRALBCREAT 14 08/22/2023   MICRALBCREAT 16 08/20/2022   He has a history of dyslipidemia but latest lipid panel was at goal: Lab Results  Component Value Date   CHOL 128 08/22/2023   HDL 38 (L) 08/22/2023   LDLCALC 74 08/22/2023   TRIG 75 08/22/2023   CHOLHDL 3.4 08/22/2023  Latest TSH was normal: Lab Results  Component Value Date   TSH 1.91 08/22/2023   - last eye exam was 08/15/2023: + DR reportedly.   - no numbness and tingling in his feet.  Foot exam: 05/02/2023.  ROS: + see HPI  I reviewed pt's medications, allergies, PMH, social hx, family hx, and changes were documented in the history of present illness. Otherwise, unchanged from my initial visit note.  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 115 mL 0   Continuous Glucose Sensor (FREESTYLE LIBRE 3 SENSOR) MISC 1 EACH BY DOES NOT APPLY ROUTE EVERY 14 (FOURTEEN) DAYS. 2 each 26   Glucagon  3 MG/DOSE POWD Place 3 mg into the nose once as needed for up to 1 dose. 1 each 11   insulin  aspart (FIASP  FLEXTOUCH) 100 UNIT/ML FlexTouch Pen INJECT UNDER SKIN 8-14 UNITS BEFORE MEALS 15 mL 2   Insulin  Aspart, w/Niacinamide, (FIASP ) 100 UNIT/ML SOLN Use up to 80 units daily in the insulin  pump 30 mL 11   Insulin  Degludec FlexTouch 200 UNIT/ML SOPN INJECT 12 UNITS IN THE MORNING AND 30 UNITS AT NIGHT (Patient taking differently: INJECT 42 UNITS IN THE MORNING) 27 mL 3   Insulin  Disposable Pump (OMNIPOD 5 G6 INTRO, GEN 5,) KIT 1 each by Does not apply route as needed. (Patient not taking: Reported on 08/22/2023) 1 kit 0   Insulin  Disposable Pump (OMNIPOD 5 G6 PODS, GEN 5,) MISC 1 each by Does not apply route every 3 (three) days. (Patient not taking: Reported on 08/22/2023) 30 each 3   insulin  glargine-yfgn (SEMGLEE , YFGN,) 100 UNIT/ML Pen INJECT 12 UNITS IN THE MORNING AND 30 UNITS AT NIGHT 45 mL 3   ONETOUCH VERIO test strip USE TO CHECK BLOOD SUGAR 4 TIMES A DAY 400 strip 12   tamsulosin  (FLOMAX ) 0.4 MG CAPS capsule Take 1 capsule (0.4 mg total) by mouth daily. 30 capsule 0   No current facility-administered medications for this visit.    Allergies  Allergen Reactions   Basaglar  Kwikpen [Insulin  Glargine] Other (See Comments)    Ineffective   Humalog  [Insulin  Lispro] Other (See Comments)    Ineffective   Family history: None  PE: BP 122/80   Pulse 83   Resp 16   Ht 5' 5 (1.651 m)  Wt 179 lb 6.4 oz (81.4 kg)   SpO2 98%   BMI 29.85 kg/m   Wt Readings from Last 3 Encounters:  01/12/24 179 lb 6.4 oz (81.4 kg)  08/22/23 178 lb 12.8 oz (81.1 kg)  06/25/23 175 lb 4.3 oz (79.5 kg)   Constitutional: overweight, in NAD Eyes: no exophthalmos ENT: no masses palpated in neck, no cervical lymphadenopathy Cardiovascular: RRR, No MRG Respiratory: CTA B Musculoskeletal: no deformities Skin:  no rashes Neurological: no tremor with outstretched hands   ASSESSMENT: 1. DM1, uncontrolled, with complications (background DR)  -He keeps his blood sugars higher during the summer on purpose, as he is more active, mowing -Per  records from Dr. Damian, patient's diabetes was never very uncontrolled, his range is 7.2-7.6%. I suspect that he still has some insulin  production from his pancreas.  -He was not interested in an insulin  pump.  2. Dyslipidemia  PLAN:  1. Patient with longstanding, uncontrolled, type 1 diabetes, basal-bolus insulin  regimen, with large variability in his blood sugars throughout the day.  At last visit, HbA1c was lower, at 5.4%, and sugars were fluctuating mostly within the target range but he did have hyperglycemic spikes after lunch, dinner, also some very high blood sugars during the night.  She also had many instances of low blood sugars during the day.  We discussed about reducing the dose of his Tresiba  especially as he was also planning to be more active during the summer.  He was correcting the high blood sugars after meals and sugars were dropping abruptly afterwards.  We discussed about trying not to correct these if possible. I again recommended an insulin  pump.  Advised him to check with his  insurance to see if this was covered. CGM interpretation: -At today's visit, we reviewed his CGM downloads: It appears that 61% of values are in target range (goal >70%), while 34% are higher than 180 (goal <25%), and 5% are lower than 70 (goal <4%).  The calculated average blood sugar is 165.  The projected HbA1c for the next 3 months (GMI) is 7.3%. -Reviewing the CGM trends, sugars are still extremely fluctuating, crossing the normal intervals only between high or low normal range, with more consistent high blood sugars after dinner.  He is not bolusing more than 3 units for correction for high blood sugars.  He is also doing a good job of taking Fiasp  before every meal and actually waiting 15 minutes between the injection and the meal.  I am reticent to recommend that he increases his dose of Fiasp  more so we will continue the same doses for now but due to the many low blood sugars, I recommended to decrease his dose of Tresiba  more.  -At today's visit, we again discussed about starting an insulin  pump.  I did recommend the iLET pump for him, and we discussed about particular days of this pump compared to the rest of the pumps if this is not covered by his insurance, we could try the t:slim or the Mobi pump or even the OmniPod, although my preference would be for iLet pump for him.  I gave him a brochure and advised him to let me know his decision so we can start paperwork. -At today's visit I also advised him to download the latest freestyle libre app and sent a prescription for [employer] 3+ to his pharmacy - I advised him to:  Patient Instructions  Please decrease: - Tresiba  U200 30 units daily  Use: - FiAsp : 10-14  units before meals + sliding scale Target 150 ISF 50 This means: - 150-200: + 1 units  - 201-250: + 2 units  - >250: + 3 units   Try not correct high blood sugars after meals.   Check with your insurance if we can use the iLet pump. If not, we can use the t:slim X2,  mobi, Omnipod 5.   Please come back for a follow-up appointment in 3-4 months.  - we checked his HbA1c: 6.4% (higher) - advised to check sugars at different times of the day - 4x a day, rotating check times - advised for yearly eye exams >> he is UTD - At last visit, his kidney function was slightly lower and ACR was still elevated.  I plan to recheck these again at next visit. - return to clinic in 3-4 months  2.  Dyslipidemia - Latest lipid panel was at goal except for slightly low HDL: Lab Results  Component Value Date   CHOL 128 08/22/2023   HDL 38 (L) 08/22/2023   LDLCALC 74 08/22/2023   TRIG 75 08/22/2023   CHOLHDL 3.4 08/22/2023  - He is not on a statin  Alani Sabbagh, MD PhD [facility]

## 2024-01-15 ENCOUNTER — Other Ambulatory Visit: Payer: Self-pay | Admitting: Internal Medicine

## 2024-01-15 DIAGNOSIS — E103299 Type 1 diabetes mellitus with mild nonproliferative diabetic retinopathy without macular edema, unspecified eye: Secondary | ICD-10-CM

## 2024-01-16 ENCOUNTER — Other Ambulatory Visit: Payer: Self-pay | Admitting: Internal Medicine

## 2024-01-16 DIAGNOSIS — E103299 Type 1 diabetes mellitus with mild nonproliferative diabetic retinopathy without macular edema, unspecified eye: Secondary | ICD-10-CM

## 2024-05-14 ENCOUNTER — Ambulatory Visit: Admitting: Internal Medicine

## 2024-05-18 ENCOUNTER — Ambulatory Visit: Admitting: Internal Medicine

## 2024-05-18 ENCOUNTER — Encounter: Payer: Self-pay | Admitting: Internal Medicine

## 2024-05-18 DIAGNOSIS — E103299 Type 1 diabetes mellitus with mild nonproliferative diabetic retinopathy without macular edema, unspecified eye: Secondary | ICD-10-CM

## 2024-05-18 MED ORDER — FIASP FLEXTOUCH 100 UNIT/ML ~~LOC~~ SOPN: PEN_INJECTOR | SUBCUTANEOUS | 2 refills | Status: AC

## 2024-05-18 MED ORDER — INSULIN PEN NEEDLE 32G X 4 MM MISC: 3 refills | Status: AC

## 2024-05-18 MED ORDER — GLUCAGON 3 MG/DOSE NA POWD: 3.0000 mg | Freq: Once | NASAL | 11 refills | Status: AC | PRN

## 2024-05-18 NOTE — Progress Notes (Signed)
 Patient ID: Brian Braun, male   DOB: 01/29/1986, 39 y.o.   MRN: [id number]   HPI: Brian Braun is a 39 y.o.-year-old male, returning for f/u for DM1, dx 1996, uncontrolled, with long-term complications (DR). Last visit 4 months ago.  He now has a new insurance: [employer].  Interim hx: He denies increased urination, blurry vision, nausea, chest pain. 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  In the last week, due to the snow, it did not work much and had to use vacation days.  Reviewed HbA1c levels: Lab Results  Component Value Date   HGBA1C 6.4 (A) 01/12/2024   HGBA1C 5.4 08/22/2023   HGBA1C 6.8 (A) 05/02/2023   HGBA1C 6.9 (A) 12/27/2022   HGBA1C 6.7 (A) 08/20/2022   HGBA1C 7.0 (A) 05/04/2022   HGBA1C 6.6 (A) 12/29/2021   HGBA1C 6.7 (A) 08/18/2021   HGBA1C 7.7 (A) 06/18/2021   HGBA1C 7.7 (A) 02/12/2021   HGBA1C 7.0 (A) 10/09/2020   HGBA1C 8.5 (A) 06/05/2020   HGBA1C 7.6 (A) 01/29/2020   HGBA1C 7.7 (A) 09/27/2019   HGBA1C 7.6 (A) 12/11/2018   HGBA1C 7.8 (A) 07/13/2018   HGBA1C 7.5 (A) 03/13/2018   HGBA1C 7.6 (A) 11/10/2017   HGBA1C 7.1 07/14/2017   HGBA1C 7.4 03/18/2017  01/2019: work: HbA1c 7.0% 01/2018: HbA1c (work): 6.8% (Novolog ) 03/25/2016: HbA1c 6.2% 10/11/2013: 7.6% 06/13/2013: 7.3%  He was previously on the Medtronic MiniMed insulin  pump up to 2011, but he developed DKA episodes and afterwards, he was not interested in restarting the pump.    He is on: - Lantus  40-42 >> ... >> Semglee  16 >> ... Tresiba  U200 12 units in a.m. and 30 units in hs >> 42 >> 34 >> 30 units daily - Humalog  >> FiAsp :  (8) 10-14 milligram units before meals + sliding scale Target 150 ISF 30 >> ISF 50 This means: - 150-200: + 1 units  - 201-250: + 2 units  - >250: + 3 units   Glucometer: Accu-Chek expert meter >> One Touch Verio He continues on Humalog , which is not as efficient for him as NovoLog , but he had a very high co-pay for NovoLog  ($600)  while for Humalog  he only has to pay $25.  Pt checks his sugars >4 times a day with his CGM:   Previously:  Previously:   Lowest sugar was 50s >> 40s;  he has hypoglycemia awareness in the 60s. He has an unexpired glucagon  kit at home. Highest sugar was 471 >> .SABRASABRA 400 >> HI. He had one DKA admission in 2011.  Pt's meals are: - Breakfast: oatmeal or bacon and eggs + cheese biscuit - Lunch: sandwich or hamburger - Dinner: grilled chicken and rice; spaghetti; sloppy joes - Snacks:PB crackers at 10 am; cheeze it's, drinks Gatorade  + CKD, last BUN/creatinine: Lab Results  Component Value Date   BUN 11 08/22/2023   CREATININE 1.38 (H) 08/22/2023   Lab Results  Component Value Date   GFR 68.68 06/18/2021   GFR 59.65 (L) 09/27/2019   GFR 54.44 (L) 03/25/2015   GFR 78.07 04/09/2014   No MAU: Lab Results  Component Value Date   MICRALBCREAT 14 08/22/2023   MICRALBCREAT 16 08/20/2022   He has a history of dyslipidemia but latest lipid panel was at goal: Lab Results  Component Value Date   CHOL 128 08/22/2023   HDL 38 (L) 08/22/2023   LDLCALC 74 08/22/2023   TRIG 75 08/22/2023  CHOLHDL 3.4 08/22/2023   Latest TSH was normal: Lab Results  Component Value Date   TSH 1.91 08/22/2023   - last eye exam was 08/16/2023: + DR reportedly.   - no numbness and tingling in his feet.  Foot exam: 05/02/2023.  ROS: + see HPI  I reviewed pt's medications, allergies, PMH, social hx, family hx, and changes were documented in the history of present illness. Otherwise, unchanged from my initial visit note.  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 115 mL 0   Continuous Glucose Sensor (FREESTYLE LIBRE 3 PLUS SENSOR) MISC 1 each by Does not apply route every 14 (fourteen) days. 6 each 3   Glucagon  3 MG/DOSE POWD Place 3 mg into the nose once as needed for up to 1 dose. 1 each 11   insulin  aspart (FIASP  FLEXTOUCH) 100 UNIT/ML FlexTouch Pen INJECT UNDER SKIN 8-14 UNITS BEFORE MEALS 15 mL 2   Insulin  Aspart, w/Niacinamide, (FIASP ) 100 UNIT/ML SOLN Use up to 80 units daily in the insulin  pump 30 mL 11   insulin  degludec (TRESIBA  FLEXTOUCH) 200 UNIT/ML FlexTouch Pen INJECT 30 UNITS daily 9 mL 11   Insulin  Disposable Pump (OMNIPOD 5 G6 INTRO, GEN 5,) KIT 1 each by Does not apply route as needed. (Patient not taking: Reported on 01/12/2024) 1 kit 0   Insulin  Disposable Pump (OMNIPOD 5 G6 PODS, GEN 5,) MISC 1 each by Does not apply route every 3 (three) days. (Patient not taking: Reported on 01/12/2024) 30 each 3   ONETOUCH VERIO test strip USE TO CHECK BLOOD SUGAR 4 TIMES A DAY 400 strip 12   tamsulosin  (FLOMAX ) 0.4 MG CAPS capsule Take 1 capsule (0.4 mg total) by mouth daily. 30 capsule 0   No current facility-administered medications for this visit.   Allergies  Allergen Reactions   Basaglar  Kwikpen [Insulin  Glargine] Other (See Comments)    Ineffective   Humalog  [Insulin  Lispro] Other (See Comments)     Ineffective   Family history: None  PE: BP 120/80   Pulse 84   Ht 5' 5 (1.651 m)   Wt 177 lb 12.8 oz (80.6 kg)   SpO2 97%   BMI 29.59 kg/m   Wt Readings from Last 3 Encounters:  05/18/24 177 lb 12.8 oz (80.6 kg)  01/12/24 179  lb 6.4 oz (81.4 kg)  08/22/23 178 lb 12.8 oz (81.1 kg)   Constitutional: overweight, in NAD Eyes: no exophthalmos ENT: no masses palpated in neck, no cervical lymphadenopathy Cardiovascular: RRR, No MRG Respiratory: CTA B Musculoskeletal: no deformities Skin:  no rashes Neurological: no tremor with outstretched hands   Diabetic Foot Exam - Simple   Simple Foot Form Diabetic Foot exam was performed with the following findings: Yes 05/18/2024  4:30 PM  Visual Inspection No deformities, no ulcerations, no other skin breakdown bilaterally: Yes Sensation Testing Intact to touch and monofilament testing bilaterally: Yes Pulse Check Posterior Tibialis and Dorsalis pulse intact bilaterally: Yes Comments + dry skin + calluses medial hallux B + onychodystrophy R hallux toenail     ASSESSMENT: 1. DM1, uncontrolled, with complications (background DR)  -He keeps his blood sugars higher during the summer on purpose, as he is more active, mowing -Per  records from Dr. Damian, patient's diabetes was never very uncontrolled, his range is 7.2-7.6%. I suspect that he still has some insulin  production from his pancreas.  -He was not interested in an insulin  pump.  2. Dyslipidemia  PLAN:  1. Patient with longstanding, uncontrolled, type 1 diabetes, on basal-bolus insulin  regimen, with large variability in his blood sugars throughout the day.  At last visit, HbA1c was slightly higher, but still at goal, at 6.4% but he still had extremely fluctuating blood sugars, only crossing the normal intervals and transition between high or low ranges.  He was doing a good job taking Fiasp  before every meal and actually waiting 15 minutes between the injection and the meal but  this was still not enough to reduce variability.  I did recommend to avoid correcting high blood sugars after meals and decrease his Tresiba  dose even more to avoid further lows but we again discussed about starting an insulin  pump, which I highly recommended.  I advised him to look into the islet insulin  pump but is not covered, to look at [facility] or tandem pumps. - At last visit I also advised him to download the latest freestyle libre And we changed to [facility] 3+ sensor. CGM interpretation: -At today's visit, we reviewed his CGM downloads: It appears that 62% of values are in target range (goal >70%), while 35% are higher than 180 (goal <25%), and 3% are lower than 70 (goal <4%).  The calculated average blood sugar is 158.  The projected HbA1c for the next 3 months (GMI) is 7.1%. -Reviewing the CGM trends, sugars appear to be still fluctuating, but improved in the early morning and with less frequent and possibly less significant hyperglycemia later in the day.  He still have many lows, which upon questioning, are due to still correcting high blood sugars with too much insulin .  He is occasionally taking 3 or 4 units for correction and at today's visit I advised him to not use more than 1 to 2 units, even if he is less active, which he ate, rightfully, tries to take into consideration calculating his correction bolus.  Also, he is occasionally taking 2 little Fiasp  before the meals, for example this morning, when he only took 8 units and sugars increased to higher 200s after breakfast.  I advised him to use 10 to 14 units, as previously discussed. -At today's visit, we again discussed about possibly starting an insulin  pump.  Due to having a toddler at home, he would like to use a tubeless pump.  I do not feel that OmniPod 5 would be  a good fit for him but I advised him about mobi tubeless.  This will come out soon.  Also, iLET and Medtronic will come out with tubeless version.  Will discuss again at next  visit. - I advised him to:  Patient Instructions  Please continue: - Tresiba  U200 30 units daily - FiAsp : 10-14 units before meals + sliding scale Target 150 ISF 50 This means: - 150-200: + 1 units  - 201-250: + 2 units  - >250: + 3 units   Try not correct high blood sugars after meals.   Check with your insurance if we can use the iLet pump. If not, we can use the t:slim X2, mobi, Omnipod 5.   Please come back for a follow-up appointment in 3-4 months.  - we checked his HbA1c: 6.5% (slightly higher) - advised to check sugars at different times of the day - 4x a day, rotating check times - advised for yearly eye exams >> he is UTD - Latest kidney function was slightly lower and ACR was still elevated.  Will recheck these at next visit. - return to clinic in 3-4 months   2.  Dyslipidemia - Latest lipid panel was at goal except for slightly low HDL: Lab Results  Component Value Date   CHOL 128 08/22/2023   HDL 38 (L) 08/22/2023   LDLCALC 74 08/22/2023   TRIG 75 08/22/2023   CHOLHDL 3.4 08/22/2023  - He is not on a statin  Hannan Hutmacher, MD PhD [facility]

## 2024-05-18 NOTE — Patient Instructions (Addendum)
 Please continue: - Tresiba  U200 30 units daily - FiAsp  10-14 units before meals + sliding scale Target 150 ISF 50 This means: 150-200: + 1 units  > 200: + 2 units   DO NOT CORRECT WITH MORE THAN 2 UNITS!   Look up mobi tubeless. iLET and Medtronic are also coming up with tubeless versions later.  Please return in 3 months.

## 2024-07-27 ENCOUNTER — Ambulatory Visit: Admitting: Internal Medicine
